# Patient Record
Sex: Female | Born: 1970
Health system: Southern US, Community
[De-identification: ages and names within clinical notes are randomized; demographics above are authoritative.]

## PROBLEM LIST (undated history)

## (undated) DIAGNOSIS — D649 Anemia, unspecified: Secondary | ICD-10-CM

## (undated) DIAGNOSIS — T7840XA Allergy, unspecified, initial encounter: Secondary | ICD-10-CM

## (undated) HISTORY — DX: Allergy, unspecified, initial encounter: T78.40XA

## (undated) HISTORY — DX: Anemia, unspecified: D64.9

---
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
## 1993-01-19 HISTORY — PX: OTHER SURGICAL HISTORY: SHX169

## 1999-01-11 ENCOUNTER — Emergency Department (HOSPITAL_COMMUNITY): Admission: EM | Admit: 1999-01-11 | Discharge: 1999-01-11 | Payer: Self-pay | Admitting: Emergency Medicine

## 2000-05-12 ENCOUNTER — Other Ambulatory Visit: Admission: RE | Admit: 2000-05-12 | Discharge: 2000-05-12 | Payer: Self-pay | Admitting: *Deleted

## 2002-10-16 ENCOUNTER — Other Ambulatory Visit: Admission: RE | Admit: 2002-10-16 | Discharge: 2002-10-16 | Payer: Self-pay | Admitting: Obstetrics and Gynecology

## 2007-04-19 LAB — CONVERTED CEMR LAB: Pap Smear: NORMAL

## 2007-08-15 ENCOUNTER — Ambulatory Visit: Payer: Self-pay | Admitting: *Deleted

## 2007-08-15 ENCOUNTER — Ambulatory Visit (HOSPITAL_BASED_OUTPATIENT_CLINIC_OR_DEPARTMENT_OTHER): Admission: RE | Admit: 2007-08-15 | Discharge: 2007-08-15 | Payer: Self-pay | Admitting: *Deleted

## 2007-08-15 DIAGNOSIS — J309 Allergic rhinitis, unspecified: Secondary | ICD-10-CM | POA: Insufficient documentation

## 2007-08-15 DIAGNOSIS — J45909 Unspecified asthma, uncomplicated: Secondary | ICD-10-CM | POA: Insufficient documentation

## 2007-08-15 DIAGNOSIS — D649 Anemia, unspecified: Secondary | ICD-10-CM | POA: Insufficient documentation

## 2007-08-15 DIAGNOSIS — R0602 Shortness of breath: Secondary | ICD-10-CM | POA: Insufficient documentation

## 2009-09-30 ENCOUNTER — Emergency Department (HOSPITAL_COMMUNITY): Admission: EM | Admit: 2009-09-30 | Discharge: 2009-09-30 | Payer: Self-pay | Admitting: Family Medicine

## 2009-10-02 ENCOUNTER — Ambulatory Visit: Payer: Self-pay | Admitting: Family Medicine

## 2009-10-02 DIAGNOSIS — F329 Major depressive disorder, single episode, unspecified: Secondary | ICD-10-CM

## 2009-10-02 DIAGNOSIS — R002 Palpitations: Secondary | ICD-10-CM | POA: Insufficient documentation

## 2009-10-02 DIAGNOSIS — M25519 Pain in unspecified shoulder: Secondary | ICD-10-CM

## 2009-10-02 DIAGNOSIS — F419 Anxiety disorder, unspecified: Secondary | ICD-10-CM

## 2009-10-02 DIAGNOSIS — F418 Other specified anxiety disorders: Secondary | ICD-10-CM | POA: Insufficient documentation

## 2009-10-03 ENCOUNTER — Telehealth (INDEPENDENT_AMBULATORY_CARE_PROVIDER_SITE_OTHER): Payer: Self-pay | Admitting: *Deleted

## 2009-10-07 LAB — CONVERTED CEMR LAB
ALT: 10 units/L (ref 0–35)
AST: 22 units/L (ref 0–37)
Alkaline Phosphatase: 52 units/L (ref 39–117)
BUN: 13 mg/dL (ref 6–23)
Basophils Absolute: 0 10*3/uL (ref 0.0–0.1)
Bilirubin, Direct: 0.2 mg/dL (ref 0.0–0.3)
Calcium: 9.8 mg/dL (ref 8.4–10.5)
Eosinophils Relative: 0.2 % (ref 0.0–5.0)
Folate: 6.7 ng/mL
GFR calc non Af Amer: 83.2 mL/min (ref >60)
Glucose, Bld: 93 mg/dL (ref 70–99)
Iron: 180 ug/dL — ABNORMAL HIGH (ref 42–145)
Lymphocytes Relative: 11.6 % — ABNORMAL LOW (ref 12.0–46.0)
Monocytes Relative: 4.7 % (ref 3.0–12.0)
Platelets: 242 10*3/uL (ref 150.0–400.0)
Potassium: 4.2 meq/L (ref 3.5–5.1)
RDW: 13.1 % (ref 11.5–14.6)
Sodium: 143 meq/L (ref 135–145)
Total Bilirubin: 1.1 mg/dL (ref 0.3–1.2)
Transferrin: 290.5 mg/dL (ref 212.0–360.0)
WBC: 10.7 10*3/uL — ABNORMAL HIGH (ref 4.5–10.5)

## 2009-10-13 ENCOUNTER — Encounter: Payer: Self-pay | Admitting: Internal Medicine

## 2009-12-31 ENCOUNTER — Telehealth (INDEPENDENT_AMBULATORY_CARE_PROVIDER_SITE_OTHER): Payer: Self-pay | Admitting: *Deleted

## 2010-01-01 ENCOUNTER — Ambulatory Visit: Payer: Self-pay | Admitting: Family Medicine

## 2010-01-03 LAB — CONVERTED CEMR LAB
Basophils Absolute: 0 10*3/uL (ref 0.0–0.1)
HCT: 36.7 % (ref 36.0–46.0)
Hemoglobin: 12.7 g/dL (ref 12.0–15.0)
Lymphs Abs: 1.8 10*3/uL (ref 0.7–4.0)
MCHC: 34.5 g/dL (ref 30.0–36.0)
MCV: 91.2 fL (ref 78.0–100.0)
Monocytes Absolute: 0.4 10*3/uL (ref 0.1–1.0)
Monocytes Relative: 6 % (ref 3.0–12.0)
Neutro Abs: 3.8 10*3/uL (ref 1.4–7.7)
RDW: 13.2 % (ref 11.5–14.6)

## 2010-02-03 ENCOUNTER — Ambulatory Visit
Admission: RE | Admit: 2010-02-03 | Discharge: 2010-02-03 | Payer: Self-pay | Source: Home / Self Care | Attending: Family Medicine | Admitting: Family Medicine

## 2010-02-18 NOTE — Assessment & Plan Note (Signed)
Summary: new to est/kn   Vital Signs:  Patient profile:   40 year old female Height:      66 inches Weight:      156.4 pounds BMI:     25.33 Temp:     98.6 degrees F oral Pulse rate:   76 / minute Pulse rhythm:   regular BP sitting:   116 / 68  (right arm)  Vitals Entered By: Almeta Monas CMA Duncan Dull) (October 02, 2009 10:05 AM) CC: c/o pain in the right shoulder and difficulty sleeping at night and palpitations   Preventive Screening-Counseling & Management  Alcohol-Tobacco     Smoking Status: never  Caffeine-Diet-Exercise     Does Patient Exercise: no      Drug Use:  no.    Current Medications (verified): 1)  Microgestin Fe 1/20 1-20 Mg-Mcg  Tabs (Norethin Ace-Eth Estrad-Fe) .... Take 1 Tablet By Mouth Once A Day 2)  Complete Womens   Tabs (Multiple Vitamins-Minerals) .... Take 1 Tablet By Mouth Once A Day 3)  Calcium 500/d 500-200 Mg-Unit  Tabs (Calcium Carbonate-Vitamin D) .Marland Kitchen.. 1 Tab Once Daily 4)  Claritin 10 Mg  Tabs (Loratadine) .... One Tab By Mouth Qd 5)  Ventolin Hfa 108 (90 Base) Mcg/act  Aers (Albuterol Sulfate) .Marland Kitchen.. 1-2 Puffs Every 4 Hours As Needed Shortness of Breath 6)  Augmentin 875-125 Mg Tabs (Amoxicillin-Pot Clavulanate) .Marland Kitchen.. 1 By Mouth Once Daily X14 Days 7)  Prednisone 5 Mg Tabs (Prednisone) .... 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  Otoscopic examination reveals clear canals, tympanic membranes are intact bilaterally without bulging, retraction, inflammation or discharge. Hearing is grossly normal bilaterally. Nose:  External nasal examination shows no deformity or inflammation. Nasal mucosa are pink and moist without lesions or exudates. Mouth:  Oral mucosa and oropharynx without lesions or exudates.  Teeth in good repair. Neck:  No deformities, masses, or tenderness noted. Lungs:  Normal respiratory effort, chest expands symmetrically. Lungs are clear to auscultation, no crackles or wheezes. Heart:  normal rate and no murmur.   Extremities:  No clubbing, cyanosis, edema, or deformity noted with normal full range of motion of all joints.   Skin:  Intact without suspicious lesions or rashes Psych:  Oriented X3, subdued, and tearful.     Impression & Recommendations:  Problem # 1:  PALPITATIONS, RECURRENT (ICD-785.1)  Orders: Venipuncture (16109) TLB-B12 + Folate Pnl (60454_09811-B14/NWG) TLB-IBC Pnl (Iron/FE;Transferrin) (83550-IBC) TLB-BMP (Basic Metabolic Panel-BMET) (80048-METABOL) TLB-CBC Platelet - w/Differential (85025-CBCD) TLB-Hepatic/Liver Function Pnl (80076-HEPATIC) TLB-TSH (Thyroid Stimulating Hormone) (95621-HYQ) Cardiology Referral (Cardiology) EKG w/ Interpretation (93000) UA Dipstick w/o Micro (manual) (65784) Urine Pregnancy Test  (69629)  Avoid caffeine, chocolate, and stimulants. Stress reduction as well as medication options discussed.   Problem # 2:  SHOULDER PAIN, RIGHT (ICD-719.41)  Discussed shoulder exercises, use of moist heat or ice, and medication.   Orders: EKG  w/ Interpretation (93000) UA Dipstick w/o Micro (manual) (16109) Urine Pregnancy Test  (60454)  Problem # 3:  ANXIETY STATE, UNSPECIFIED (ICD-300.00)  Her updated medication list for this problem includes:    Ativan 0.5 Mg  Tabs (Lorazepam) .Marland Kitchen... 1/2- 1 by mouth three times a day as needed  Orders: Venipuncture (09811) TLB-B12 + Folate Pnl (91478_29562-Z30/QMV) TLB-IBC Pnl (Iron/FE;Transferrin) (83550-IBC) TLB-BMP (Basic Metabolic Panel-BMET) (80048-METABOL) TLB-CBC Platelet - w/Differential (85025-CBCD) TLB-Hepatic/Liver Function Pnl (80076-HEPATIC) TLB-TSH (Thyroid Stimulating Hormone) (84443-TSH) EKG w/ Interpretation (93000) UA Dipstick w/o Micro (manual) (78469) Urine Pregnancy Test  (62952)  Discussed medication use and relaxation techniques.   Problem # 4:  ANEMIA-NOS (ICD-285.9)  Orders: Venipuncture (84132) TLB-B12 + Folate Pnl (44010_27253-G64/QIH) TLB-IBC Pnl (Iron/FE;Transferrin) (83550-IBC) TLB-BMP (Basic Metabolic Panel-BMET) (80048-METABOL) TLB-CBC Platelet - w/Differential (85025-CBCD) TLB-Hepatic/Liver Function Pnl (80076-HEPATIC) TLB-TSH (Thyroid Stimulating Hormone) (84443-TSH) EKG w/ Interpretation (93000) UA Dipstick w/o Micro (manual) (47425) Urine Pregnancy Test  (95638)  Complete Medication List: 1)  Microgestin Fe 1/20 1-20 Mg-mcg Tabs (Norethin ace-eth estrad-fe) .... Take 1 tablet by mouth once a day 2)  Complete Womens Tabs (Multiple vitamins-minerals) .... Take 1 tablet by mouth once a day 3)  Calcium 500/d 500-200 Mg-unit Tabs (Calcium carbonate-vitamin d) .Marland Kitchen.. 1 tab once daily 4)  Claritin 10 Mg Tabs (Loratadine) .... One tab by mouth qd 5)  Ventolin Hfa 108 (90 Base) Mcg/act Aers (Albuterol sulfate) .Marland Kitchen.. 1-2 puffs every 4 hours as needed shortness of breath 6)  Augmentin 875-125 Mg Tabs (Amoxicillin-pot clavulanate) .Marland Kitchen.. 1 by mouth once daily x14 days 7)  Prednisone 5 Mg Tabs (Prednisone) .... Take as directed dose pack 8)  Ativan 0.5 Mg Tabs (Lorazepam) .... 1/2- 1 by mouth three times a day as needed  Patient Instructions: 1)  make appointment for about 2 weeks after monitor is completed. Prescriptions: ATIVAN 0.5 MG TABS (LORAZEPAM) 1/2- 1 by mouth three  times a day as needed  #60 x 0   Entered and Authorized by:   Loreen Freud DO   Signed by:   Loreen Freud DO on 10/02/2009   Method used:   Print then Give to Patient   RxID:   [id number]   Appended Document: new to est/kn

## 2010-02-18 NOTE — Procedures (Signed)
Summary: Summary Report  Summary Report   Imported By: Erle Crocker 11/06/2009 16:04:13  _____________________________________________________________________  External Attachment:    Type:   Image     Comment:   External Document

## 2010-02-18 NOTE — Progress Notes (Signed)
Summary: monitor  Phone Note Other Incoming   Action Taken: Appt scheduled Summary of Call: Talk to pt about event monitot patient would like monitor mail to her home ,this monitor will be mail by [employer] Initial call taken by: Marcos Eke,  October 03, 2009 12:41 PM

## 2010-02-20 NOTE — Assessment & Plan Note (Signed)
Summary: 1 month follow-up medication//fd   Vital Signs:  Patient profile:   40 year old female Weight:      158.0 pounds Pulse rate:   76 / minute Pulse rhythm:   regular BP sitting:   108 / 72  (right arm) Cuff size:   regular  Vitals Entered By: Almeta Monas CMA Duncan Dull) (February 03, 2010 4:29 PM) CC: F/U ON CELEXA--Per pt it makes her tired   History of Present Illness: Pt here to f/u celexa ---- it is helping her anxiety but makes her tired.    Problems Prior to Update: 1)  Family History Breast Cancer 1st Degree Relative <50  (ICD-V16.3) 2)  Shoulder Pain, Right  (ICD-719.41) 3)  Palpitations, Recurrent  (ICD-785.1) 4)  Anxiety State, Unspecified  (ICD-300.00) 5)  Reactive Airway Disease  (ICD-493.90) 6)  Other Follow-up Examination Other  (ICD-V67.59) 7)  Family History of Alcoholism/addiction  (ICD-V61.41) 8)  Shortness of Breath  (ICD-786.05) 9)  Allergic Rhinitis  (ICD-477.9) 10)  Anemia-nos  (ICD-285.9)  Medications Prior to Update: 1)  Microgestin Fe 1/20 1-20 Mg-Mcg  Tabs (Norethin Ace-Eth Estrad-Fe) .... Take 1 Tablet By Mouth Once A Day 2)  Complete Womens   Tabs (Multiple Vitamins-Minerals) .... Take 1 Tablet By Mouth Once A Day 3)  Calcium 500/d 500-200 Mg-Unit  Tabs (Calcium Carbonate-Vitamin D) .Marland Kitchen.. 1 Tab Once Daily 4)  Claritin 10 Mg  Tabs (Loratadine) .... One Tab By Mouth Qd 5)  Ventolin Hfa 108 (90 Base) Mcg/act  Aers (Albuterol Sulfate) .Marland Kitchen.. 1-2 Puffs Every 4 Hours As Needed Shortness of Breath 6)  Augmentin 875-125 Mg Tabs (Amoxicillin-Pot Clavulanate) .Marland Kitchen.. 1 By Mouth Once Daily X14 Days 7)  Prednisone 5 Mg Tabs (Prednisone) .... Take As Directed Dose Pack 8)  Ativan 0.5 Mg Tabs (Lorazepam) .... 1/2- 1 By Mouth Three Times A Day As Needed 9)  Celexa 20 Mg Tabs (Citalopram Hydrobromide) .... Take 1/2 Tab By Mouth Once Daily For 8 Days Then Increase To 1 By Mouth Once Daily  Current Medications (verified): 1)  Microgestin Fe 1/20 1-20 Mg-Mcg  Tabs  (Norethin Ace-Eth Estrad-Fe) .... Take 1 Tablet By Mouth Once A Day 2)  Complete Womens   Tabs (Multiple Vitamins-Minerals) .... Take 1 Tablet By Mouth Once A Day 3)  Calcium 500/d 500-200 Mg-Unit  Tabs (Calcium Carbonate-Vitamin D) .Marland Kitchen.. 1 Tab Once Daily 4)  Claritin 10 Mg  Tabs (Loratadine) .... One Tab By Mouth Qd 5)  Celexa 10 Mg Tabs (Citalopram Hydrobromide) .Marland Kitchen.. 1 By Mouth Once Daily  Allergies (verified): No Known Drug Allergies  Past History:  Past Medical History: Last updated: 08/15/2007 Anemia-NOS  - as a child Allergic rhinitis  Past Surgical History: Last updated: 08/15/2007 Laporoscophy for Endometriosis 1995 Tonsillectomy 1979  Family History: Last updated: 10/02/2009 Family History of Alcoholism - father Family History of Arthritis - father = rheumatoid, mother = osteoarthritis  Family History High cholesterol - mother Family History Hypertension - father Family History Breast cancer 1st degree relative <50  Social History: Last updated: 10/02/2009 Occupation:  [employer], and one more year left of law school Married no children Never Smoked Alcohol use-yes Drug use-no Regular exercise-no  Risk Factors: Alcohol Use: <1 (08/15/2007) Caffeine Use: 2 (08/15/2007) Exercise: no (10/02/2009)  Risk Factors: Smoking Status: never (10/02/2009) Passive Smoke Exposure: yes (08/15/2007)  Family History: Reviewed history from 10/02/2009 and no changes required. Family History of Alcoholism - father Family History of Arthritis - father = rheumatoid, mother = osteoarthritis  Family History High cholesterol - mother Family History Hypertension - father Family History Breast cancer 1st degree relative <50  Social History: Reviewed history from 10/02/2009 and no changes required. 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 Alert and cooperative with normal attention span and concentration. No apparent delusions, illusions, hallucinations   Impression & Recommendations:  Problem # 1:  ANXIETY STATE, UNSPECIFIED (ICD-300.00)  The following medications were removed from the medication list:    Ativan 0.5 Mg Tabs (Lorazepam) .Marland Kitchen... 1/2- 1 by mouth three times a day as needed Her updated medication list for this problem includes:    Celexa 10 Mg Tabs (Citalopram hydrobromide) .Marland Kitchen... 1 by mouth once daily  Discussed medication use and relaxation techniques.   Complete Medication List: 1)  Microgestin Fe 1/20 1-20 Mg-mcg Tabs (Norethin ace-eth estrad-fe) .... Take 1 tablet by mouth once a day 2)  Complete Womens Tabs (Multiple vitamins-minerals) .... Take 1 tablet by mouth once a day 3)  Calcium 500/d 500-200 Mg-unit Tabs (Calcium carbonate-vitamin d) .Marland Kitchen.. 1 tab once daily 4)  Claritin 10 Mg Tabs (Loratadine) .... One tab by mouth qd 5)  Celexa 10 Mg Tabs (Citalopram hydrobromide) .Marland Kitchen.. 1 by mouth once daily  Patient Instructions: 1)  Please schedule a follow-up appointment in 3 months .  Prescriptions: CELEXA 10 MG TABS (CITALOPRAM HYDROBROMIDE) 1 by mouth once daily  #30 x 5   Entered and Authorized by:   Loreen Freud DO   Signed by:   Loreen Freud DO on 02/03/2010   Method used:   Print then Give to Patient   RxID:   [id number]    Orders Added: 1)  Est. Patient Level III [62130]

## 2010-02-20 NOTE — Progress Notes (Signed)
Summary: needs results from heart monitor study  Phone Note Call from Patient Call back at [phone]   Caller: Patient Summary of Call: patient called to find out results of heart monitor study from Sept---called Dr Reece Leader office for results, but they said to call our office for results--see 10/13/2009 results  please call her at [phone] Initial call taken by: Jerolyn Shin,  December 31, 2009 2:11 PM  Follow-up for Phone Call        sinus tachy and N'sr----  if still symptomatic we can refer to cardiology Follow-up by: Loreen Freud DO,  December 31, 2009 2:21 PM  Additional Follow-up for Phone Call Additional follow up Details #1::        left message to call office............Marland KitchenFelecia Deloach CMA  December 31, 2009 3:24 PM   Pt states that she still does have some heart fluttering but would like to try anxiety med to see if this will help and if no relief she will call for referral.........Marland KitchenFelecia Deloach CMA  December 31, 2009 4:42 PM     Additional Follow-up for Phone Call Additional follow up Details #2::    needs ov 4-6 weeks after starting celexa Follow-up by: Loreen Freud DO,  December 31, 2009 5:00 PM  Additional Follow-up for Phone Call Additional follow up Details #3:: Details for Additional Follow-up Action Taken: Left message to call back.... Almeta Monas CMA Duncan Dull)  December 31, 2009 5:03 PM Left message to call office ...............Marland KitchenFelecia Deloach CMA  January 01, 2010 9:48 AM  Discuss with patient ..............Marland KitchenFelecia Deloach CMA  January 02, 2010 8:30 AM

## 2010-02-20 NOTE — Progress Notes (Signed)
Summary: Celexa   Phone Note Call from Patient Call back at [phone]   Caller: Patient Summary of Call: Pt states that she had spoke to Dr. Laury Axon at last OV about starting Celexa but at that time pt wasn't ready. Pt is now requesting an rx for Celexa but doesn't know if she needs an appt to followup first or not. Please advise. Initial call taken by: Lavell Islam,  December 31, 2009 1:50 PM  Follow-up for Phone Call        Please advise if OV needed  to discuss Celexa.Marland KitchenMarland Kitchenpt was last seen 10/02/09. Follow-up by: Almeta Monas CMA Duncan Dull),  December 31, 2009 1:57 PM  Additional Follow-up for Phone Call Additional follow up Details #1::        celexa 20 mg  #30  1 by mouth once daily , 1 refill.    take 1/2 tab by mouth once daily for 8 days then increase to 1 by mouth once daily   Additional Follow-up by: Loreen Freud DO,  December 31, 2009 2:18 PM    Additional Follow-up for Phone Call Additional follow up Details #2::    Left message to call back... Pharmacy needed.... Almeta Monas CMA Duncan Dull)  December 31, 2009 3:49 PM  pt aware rx sent in to pharmacy.Marland KitchenMarland KitchenFelecia Deloach CMA  December 31, 2009 4:45 PM    New/Updated Medications: CELEXA 20 MG TABS (CITALOPRAM HYDROBROMIDE) Take 1/2 tab by mouth once daily for 8 days then increase to 1 by mouth once daily Prescriptions: CELEXA 20 MG TABS (CITALOPRAM HYDROBROMIDE) Take 1/2 tab by mouth once daily for 8 days then increase to 1 by mouth once daily  #30 x 1   Entered by:   Jeremy Johann CMA   Authorized by:   Loreen Freud DO   Signed by:   Jeremy Johann CMA on 12/31/2009   Method used:   Faxed to ...       [facility]. [phone]* (retail)       [address]       [place], [place]  [address]       Ph: [phone] or [phone]       Fax: [phone]   RxID:   [id number]

## 2010-03-10 ENCOUNTER — Telehealth (INDEPENDENT_AMBULATORY_CARE_PROVIDER_SITE_OTHER): Payer: Self-pay | Admitting: *Deleted

## 2010-03-18 NOTE — Progress Notes (Signed)
Summary: medm not strong enough-return call  Phone Note Refill Request Call back at [phone] Message from:  Patient  Refills Requested: Medication #1:  CELEXA 10 MG TABS 1 by mouth once daily. Pt states that med is not strong enough Pt would like to increase to 15mg  to see if this would work. Pt has not filled Rx yet Pt has been used old med for 20mg  and breaking them in half to get the 10mg . Pt uses [facility] in [place].[phone].Pls advise   Follow-up for Phone Call        next dose is 20 mg ---#30  1 by mouth once daily   2 refills Follow-up by: Loreen Freud DO,  March 10, 2010 2:31 PM  Additional Follow-up for Phone Call Additional follow up Details #1::        Left message to call back.... Almeta Monas CMA Duncan Dull)  March 10, 2010 2:42 PM     Additional Follow-up for Phone Call Additional follow up Details #2::    See OV notes 02-03-10 Pt dose was decrease from 20mg  to 10mg  because it is making her tired. Pt is request to take 10mg  tab and half of that to equal 15mg . Pt does not want to go back to the 20mg  due to the med effect on her. Pls advise.........Marland KitchenFelecia Deloach CMA  March 10, 2010 2:53 PM   Additional Follow-up for Phone Call Additional follow up Details #3:: Details for Additional Follow-up Action Taken: pt is returning call .Kalyn Negrete  March 10, 2010 4:04   that is fine  yrlowne 03/10/10 430p   spoke with patient and I advised it was ok for her to go ahead and take 1.5 of the 10 mg, she voiced understanding and stated she would disgard the 20mg  and needs Rx sent to pharmacy.... Additional Follow-up by: Almeta Monas CMA Duncan Dull),  March 10, 2010 4:49 PM  New/Updated Medications: CELEXA 10 MG TABS (CITALOPRAM HYDROBROMIDE) 1 1/2 by mouth once daily Prescriptions: CELEXA 10 MG TABS (CITALOPRAM HYDROBROMIDE) 1 1/2 by mouth once daily  #45 x 0   Entered by:   Almeta Monas CMA (AAMA)   Authorized by:   Loreen Freud DO   Signed by:   Almeta Monas  CMA (AAMA) on 03/10/2010   Method used:   Faxed to ...       [facility]. [phone]* (retail)       [address]       [place], [place]  [address]       Ph: [phone] or [phone]       Fax: [phone]   RxID:   [id number]

## 2010-08-31 ENCOUNTER — Other Ambulatory Visit: Payer: Self-pay | Admitting: Family Medicine

## 2010-11-10 ENCOUNTER — Encounter: Payer: Self-pay | Admitting: Family Medicine

## 2010-11-10 ENCOUNTER — Ambulatory Visit (INDEPENDENT_AMBULATORY_CARE_PROVIDER_SITE_OTHER): Payer: Managed Care, Other (non HMO) | Admitting: Family Medicine

## 2010-11-10 VITALS — BP 116/62 | HR 80 | Temp 98.1°F | Wt 159.6 lb

## 2010-11-10 DIAGNOSIS — F411 Generalized anxiety disorder: Secondary | ICD-10-CM

## 2010-11-10 DIAGNOSIS — F419 Anxiety disorder, unspecified: Secondary | ICD-10-CM

## 2010-11-10 MED ORDER — BUPROPION HCL ER (XL) 150 MG PO TB24: ORAL_TABLET | ORAL | Status: DC

## 2010-11-10 MED ORDER — CITALOPRAM HYDROBROMIDE 10 MG PO TABS: 10.0000 mg | ORAL_TABLET | Freq: Every day | ORAL | Status: DC

## 2010-11-10 NOTE — Patient Instructions (Signed)

## 2010-11-10 NOTE — Assessment & Plan Note (Signed)
Refill celexa Add wellbutrin rto 1-2 months

## 2010-11-10 NOTE — Progress Notes (Signed)
  Subjective:    Patient ID: Angela Harrington, female    DOB: 1970-08-02, 40 y.o.   MRN: [id number]  HPI Pt here f/u depression. She is doing well but feels she still needs more get up and go but the celexa is helping a lot for anxiety.  Review of Systems as above   Objective:   Physical Exam  Constitutional: She is oriented to person, place, and time. She appears well-developed and well-nourished.  Cardiovascular: Normal rate and regular rhythm.   No murmur heard. Pulmonary/Chest: Effort normal and breath sounds normal.  Musculoskeletal: She exhibits no edema.  Neurological: She is alert and oriented to person, place, and time.  Psychiatric: She has a normal mood and affect. Her behavior is normal. Judgment and thought content normal.          Assessment & Plan:

## 2011-02-03 ENCOUNTER — Other Ambulatory Visit: Payer: Self-pay | Admitting: Family Medicine

## 2011-06-30 ENCOUNTER — Telehealth: Payer: Self-pay | Admitting: Family Medicine

## 2011-07-07 NOTE — Telephone Encounter (Signed)
Opened in error

## 2011-07-25 ENCOUNTER — Other Ambulatory Visit: Payer: Self-pay | Admitting: Family Medicine

## 2011-10-27 ENCOUNTER — Other Ambulatory Visit: Payer: Self-pay | Admitting: Family Medicine

## 2011-10-27 NOTE — Telephone Encounter (Signed)
refill Buprop 24 XL tab 150MG  requesting 90 day supply--take 1 tablet daily--last wrt 1.15.13 #30 wt/5-refills to [facility] in [place] Note PHARMACY FOR THIS REQUEST is [facility] home delivery  Last ov 10.22.12 meds mgmt.--nothing scheduled for the future

## 2011-10-27 NOTE — Telephone Encounter (Signed)
Called patient on home# ABM LM to call & schedule f/u as soon as she could

## 2011-10-27 NOTE — Telephone Encounter (Signed)
Please offer this patient an apt for follow.      KP

## 2011-11-05 MED ORDER — BUPROPION HCL ER (XL) 150 MG PO TB24: 150.0000 mg | ORAL_TABLET | Freq: Every day | ORAL | Status: DC

## 2011-11-05 NOTE — Telephone Encounter (Signed)
Coming in for 15-minute ov 10.30.13

## 2011-11-05 NOTE — Addendum Note (Signed)
Addended by: Arnette Norris on: 11/05/2011 03:39 PM   Modules accepted: Orders

## 2011-11-06 ENCOUNTER — Other Ambulatory Visit: Payer: Self-pay

## 2011-11-06 MED ORDER — BUPROPION HCL ER (XL) 150 MG PO TB24: 150.0000 mg | ORAL_TABLET | Freq: Every day | ORAL | Status: DC

## 2011-11-06 NOTE — Telephone Encounter (Signed)
Pt called left message on triage line concerning Rx Wellbutrin. Arman Bogus sent Rx to home delivery but it will take awhile for pt to receive pt need Rx sent to [facility] in [place] so I sent Rx there and called pt left a message on VM making pt aware request was done.     MW

## 2011-11-18 ENCOUNTER — Ambulatory Visit (INDEPENDENT_AMBULATORY_CARE_PROVIDER_SITE_OTHER): Payer: Managed Care, Other (non HMO) | Admitting: Family Medicine

## 2011-11-18 ENCOUNTER — Encounter: Payer: Self-pay | Admitting: Family Medicine

## 2011-11-18 VITALS — BP 108/64 | HR 77 | Temp 98.2°F | Wt 160.0 lb

## 2011-11-18 DIAGNOSIS — Z0000 Encounter for general adult medical examination without abnormal findings: Secondary | ICD-10-CM

## 2011-11-18 DIAGNOSIS — M2550 Pain in unspecified joint: Secondary | ICD-10-CM

## 2011-11-18 DIAGNOSIS — E785 Hyperlipidemia, unspecified: Secondary | ICD-10-CM

## 2011-11-18 DIAGNOSIS — F329 Major depressive disorder, single episode, unspecified: Secondary | ICD-10-CM

## 2011-11-18 DIAGNOSIS — F411 Generalized anxiety disorder: Secondary | ICD-10-CM

## 2011-11-18 LAB — HEPATIC FUNCTION PANEL
Alkaline Phosphatase: 43 U/L (ref 39–117)
Bilirubin, Direct: 0 mg/dL (ref 0.0–0.3)

## 2011-11-18 LAB — BASIC METABOLIC PANEL
CO2: 27 mEq/L (ref 19–32)
Calcium: 8.8 mg/dL (ref 8.4–10.5)
Creatinine, Ser: 0.7 mg/dL (ref 0.4–1.2)
GFR: 94.09 mL/min (ref >60.00)

## 2011-11-18 LAB — TSH: TSH: 0.92 u[IU]/mL (ref 0.35–5.50)

## 2011-11-18 LAB — LIPID PANEL
HDL: 71.2 mg/dL (ref >39.00)
VLDL: 11.6 mg/dL (ref 0.0–40.0)

## 2011-11-18 LAB — CBC WITH DIFFERENTIAL/PLATELET
Basophils Absolute: 0 10*3/uL (ref 0.0–0.1)
Basophils Relative: 0.2 % (ref 0.0–3.0)
Eosinophils Absolute: 0.1 10*3/uL (ref 0.0–0.7)
Lymphocytes Relative: 26.5 % (ref 12.0–46.0)
MCHC: 32.8 g/dL (ref 30.0–36.0)
Neutrophils Relative %: 65.5 % (ref 43.0–77.0)
RBC: 4.33 Mil/uL (ref 3.87–5.11)
RDW: 13.6 % (ref 11.5–14.6)

## 2011-11-18 MED ORDER — BUPROPION HCL ER (XL) 150 MG PO TB24: 150.0000 mg | ORAL_TABLET | Freq: Every day | ORAL | Status: DC

## 2011-11-18 MED ORDER — CITALOPRAM HYDROBROMIDE 10 MG PO TABS: ORAL_TABLET | ORAL | Status: DC

## 2011-11-18 NOTE — Patient Instructions (Addendum)

## 2011-11-18 NOTE — Assessment & Plan Note (Signed)
Pt life is less stressful now with the job change She will d/c celexa con't wellbutrin for now

## 2011-11-18 NOTE — Assessment & Plan Note (Signed)
Check labs 

## 2011-11-18 NOTE — Progress Notes (Signed)
  Subjective:    Patient ID: Angela Harrington, female    DOB: [birth year], 41 y.o.   MRN: [id number]  HPI Pt here for med refill.  Meds are working well.  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  Pt is doing well.   Review of Systems    as above Objective:   Physical Exam  Vitals reviewed. Constitutional: She is oriented to person, place, and time. She appears well-developed and well-nourished.  Cardiovascular: Normal rate, regular rhythm, normal heart sounds and intact distal pulses.   Neurological: She is alert and oriented to person, place, and time.  Psychiatric: She has a normal mood and affect. Her behavior is normal. Judgment and thought content normal.          Assessment & Plan:

## 2011-11-19 LAB — RHEUMATOID FACTOR: Rhuematoid fact SerPl-aCnc: <10 IU/mL (ref <=14)

## 2012-02-02 LAB — HM PAP SMEAR

## 2012-07-29 ENCOUNTER — Encounter: Payer: Self-pay | Admitting: Family Medicine

## 2012-07-29 ENCOUNTER — Ambulatory Visit (INDEPENDENT_AMBULATORY_CARE_PROVIDER_SITE_OTHER): Payer: Managed Care, Other (non HMO) | Admitting: Family Medicine

## 2012-07-29 VITALS — BP 112/72 | HR 84 | Temp 98.4°F | Wt 161.2 lb

## 2012-07-29 DIAGNOSIS — J019 Acute sinusitis, unspecified: Secondary | ICD-10-CM

## 2012-07-29 MED ORDER — AZELASTINE-FLUTICASONE 137-50 MCG/ACT NA SUSP: 1.0000 | Freq: Two times a day (BID) | NASAL | Status: DC

## 2012-07-29 MED ORDER — CEFUROXIME AXETIL 500 MG PO TABS: 500.0000 mg | ORAL_TABLET | Freq: Two times a day (BID) | ORAL | Status: AC

## 2012-07-29 NOTE — Progress Notes (Signed)
  Subjective:     Angela Harrington is a 42 y.o. female who presents for evaluation of sinus pain. Symptoms include: congestion, cough, facial pain, headaches, nasal congestion and sinus pressure. Onset of symptoms was 1 week ago. Symptoms have been gradually worsening since that time. Past history is significant for no history of pneumonia or bronchitis. Patient is a non-smoker.  The following portions of the patient's history were reviewed and updated as appropriate: allergies, current medications, past family history, past medical history, past social history, past surgical history and problem list.  Review of Systems Pertinent items are noted in HPI.   Objective:    BP 112/72  Pulse 84  Temp(Src) 98.4 F (36.9 C) (Oral)  Wt 161 lb 3.2 oz (73.12 kg)  BMI 26.03 kg/m2  SpO2 97% General appearance: alert, cooperative, appears stated age and no distress Ears: normal TM's and external ear canals both ears Nose: green discharge, moderate congestion, turbinates red, swollen, sinus tenderness bilateral Throat: lips, mucosa, and tongue normal; teeth and gums normal Neck: mild anterior cervical adenopathy, no JVD, supple, symmetrical, trachea midline and thyroid not enlarged, symmetric, no tenderness/mass/nodules Lungs: clear to auscultation bilaterally Heart: regular rate and rhythm, S1, S2 normal, no murmur, click, rub or gallop Lymph nodes: Cervical adenopathy: mild adenopathy    Assessment:    Acute bacterial sinusitis.    Plan:    Nasal steroids per medication orders. Antihistamines per medication orders. Ceftin per medication orders. f/u prn

## 2012-07-29 NOTE — Patient Instructions (Signed)

## 2012-08-04 ENCOUNTER — Telehealth: Payer: Self-pay | Admitting: Family Medicine

## 2012-08-04 MED ORDER — FLUCONAZOLE 150 MG PO TABS: ORAL_TABLET | ORAL | Status: DC

## 2012-08-04 NOTE — Telephone Encounter (Signed)
Patient states that the sinus infection medication she was prescribed when she came in on 07/29/12 has caused her to have a yeast infection. She is asking for something to be sent to her pharmacy for the yeast infection. She is leaving town this evening and would like it sent today.

## 2012-08-04 NOTE — Telephone Encounter (Signed)
Per Dr. Laury Axon ok to send in Diflucan 150 x 2. Notified patient and sent Rx to pharmacy. GF/RN

## 2012-12-17 ENCOUNTER — Other Ambulatory Visit: Payer: Self-pay | Admitting: Family Medicine

## 2013-02-01 ENCOUNTER — Ambulatory Visit (INDEPENDENT_AMBULATORY_CARE_PROVIDER_SITE_OTHER): Payer: Managed Care, Other (non HMO) | Admitting: Family Medicine

## 2013-02-01 ENCOUNTER — Encounter: Payer: Self-pay | Admitting: Family Medicine

## 2013-02-01 VITALS — BP 110/70 | HR 69 | Temp 98.9°F | Wt 169.2 lb

## 2013-02-01 DIAGNOSIS — F341 Dysthymic disorder: Secondary | ICD-10-CM

## 2013-02-01 DIAGNOSIS — F32A Depression, unspecified: Secondary | ICD-10-CM

## 2013-02-01 DIAGNOSIS — F329 Major depressive disorder, single episode, unspecified: Secondary | ICD-10-CM

## 2013-02-01 DIAGNOSIS — E663 Overweight: Secondary | ICD-10-CM

## 2013-02-01 DIAGNOSIS — F3289 Other specified depressive episodes: Secondary | ICD-10-CM

## 2013-02-01 DIAGNOSIS — F419 Anxiety disorder, unspecified: Secondary | ICD-10-CM

## 2013-02-01 MED ORDER — CITALOPRAM HYDROBROMIDE 10 MG PO TABS: ORAL_TABLET | ORAL | Status: DC

## 2013-02-01 MED ORDER — BUPROPION HCL ER (XL) 300 MG PO TB24: 300.0000 mg | ORAL_TABLET | Freq: Every day | ORAL | Status: DC

## 2013-02-01 NOTE — Assessment & Plan Note (Signed)
Inc wellbutrin xl 300 mg qd

## 2013-02-01 NOTE — Progress Notes (Signed)
Pre visit review using our clinic review tool, if applicable. No additional management support is needed unless otherwise documented below in the visit note. 

## 2013-02-01 NOTE — Progress Notes (Signed)
   Subjective:    Patient ID: Angela Harrington, female    DOB: 1970-07-05, 43 y.o.   MRN: [id number]  HPI Pt here for f/u depression.  She is doing well.  She is struggling with weight loss and is interested in inc wellbutrin if that could help.  No other complaints.    Review of Systems As above     Objective:   Physical Exam BP 110/70  Pulse 69  Temp(Src) 98.9 F (37.2 C) (Oral)  Wt 169 lb 3.2 oz (76.749 kg)  SpO2 98% General appearance: alert, cooperative, appears stated age and no distress Lungs: clear to auscultation bilaterally Heart: S1, S2 normal Extremities: extremities normal, atraumatic, no cyanosis or edema Neurologic: Alert and oriented X 3, normal strength and tone. Normal symmetric reflexes. Normal coordination and gait        Assessment & Plan:

## 2013-02-01 NOTE — Assessment & Plan Note (Signed)
Inc wellbutrin to 300 mg qd and con't celexa rto 3 months or sooner prn

## 2013-02-01 NOTE — Patient Instructions (Signed)

## 2013-06-22 ENCOUNTER — Encounter: Payer: Self-pay | Admitting: Family Medicine

## 2013-07-03 ENCOUNTER — Ambulatory Visit (INDEPENDENT_AMBULATORY_CARE_PROVIDER_SITE_OTHER): Payer: Managed Care, Other (non HMO) | Admitting: General Surgery

## 2013-09-28 ENCOUNTER — Ambulatory Visit (INDEPENDENT_AMBULATORY_CARE_PROVIDER_SITE_OTHER): Payer: Managed Care, Other (non HMO) | Admitting: Family Medicine

## 2013-09-28 ENCOUNTER — Ambulatory Visit (HOSPITAL_BASED_OUTPATIENT_CLINIC_OR_DEPARTMENT_OTHER)
Admission: RE | Admit: 2013-09-28 | Discharge: 2013-09-28 | Disposition: A | Discharge status: Home / Self Care | Payer: Managed Care, Other (non HMO) | Source: Ambulatory Visit | Attending: Family Medicine | Admitting: Family Medicine

## 2013-09-28 ENCOUNTER — Encounter: Payer: Self-pay | Admitting: Family Medicine

## 2013-09-28 VITALS — BP 124/72 | HR 82 | Temp 98.7°F | Wt 165.1 lb

## 2013-09-28 DIAGNOSIS — J42 Unspecified chronic bronchitis: Secondary | ICD-10-CM

## 2013-09-28 DIAGNOSIS — R0602 Shortness of breath: Secondary | ICD-10-CM | POA: Insufficient documentation

## 2013-09-28 DIAGNOSIS — R0989 Other specified symptoms and signs involving the circulatory and respiratory systems: Secondary | ICD-10-CM

## 2013-09-28 DIAGNOSIS — R0609 Other forms of dyspnea: Secondary | ICD-10-CM

## 2013-09-28 MED ORDER — ALBUTEROL SULFATE HFA 108 (90 BASE) MCG/ACT IN AERS: 2.0000 | INHALATION_SPRAY | Freq: Four times a day (QID) | RESPIRATORY_TRACT | Status: DC | PRN

## 2013-09-28 MED ORDER — BECLOMETHASONE DIPROPIONATE 40 MCG/ACT IN AERS: 2.0000 | INHALATION_SPRAY | Freq: Two times a day (BID) | RESPIRATORY_TRACT | Status: DC

## 2013-09-28 NOTE — Progress Notes (Signed)
Pre visit review using our clinic review tool, if applicable. No additional management support is needed unless otherwise documented below in the visit note. 

## 2013-09-28 NOTE — Patient Instructions (Signed)

## 2013-09-28 NOTE — Progress Notes (Signed)
  Subjective:     Angela Harrington is a 43 y.o. female who presents for evaluation of sinus pain. Symptoms include: congestion, facial pain, headaches, nasal congestion, post nasal drip, sinus pressure, sore throat and ear pressure. Onset of symptoms was 1 month ago. Symptoms have been gradually worsening since that time. Past history is significant for frequent episodes of bronchitis. Patient is a non-smoker.  The following portions of the patient's history were reviewed and updated as appropriate: allergies, current medications, past family history, past medical history, past social history, past surgical history and problem list.  Review of Systems Pertinent items are noted in HPI.   Objective:    BP 124/72  Pulse 82  Temp(Src) 98.7 F (37.1 C) (Oral)  Wt 165 lb 2 oz (74.9 kg)  SpO2 98% General appearance: alert, cooperative, appears stated age and no distress Ears: normal TM's and external ear canals both ears Nose: clear d/c, MM moist, pink Throat: lips, mucosa, and tongue normal; teeth and gums normal Neck: mild anterior cervical adenopathy, supple, symmetrical, trachea midline and thyroid not enlarged, symmetric, no tenderness/mass/nodules Lungs: clear to auscultation bilaterally Heart: S1, S2 normal    Assessment:    Acute allergic sinusitis.    Plan:    Nasal saline sprays. Nasal steroids per medication orders. Antihistamines per medication orders. f/u prn

## 2013-09-29 ENCOUNTER — Encounter: Payer: Self-pay | Admitting: Family Medicine

## 2013-10-04 ENCOUNTER — Telehealth: Payer: Self-pay

## 2013-10-04 NOTE — Telephone Encounter (Signed)
Patient called for x-ray results. Gave results per radiology report.

## 2014-01-08 ENCOUNTER — Other Ambulatory Visit: Payer: Self-pay | Admitting: Family Medicine

## 2014-02-03 ENCOUNTER — Other Ambulatory Visit: Payer: Self-pay | Admitting: Family Medicine

## 2014-02-22 ENCOUNTER — Other Ambulatory Visit: Payer: Self-pay | Admitting: Obstetrics and Gynecology

## 2014-02-22 DIAGNOSIS — R928 Other abnormal and inconclusive findings on diagnostic imaging of breast: Secondary | ICD-10-CM

## 2014-02-23 ENCOUNTER — Encounter (HOSPITAL_COMMUNITY): Payer: Self-pay | Admitting: Emergency Medicine

## 2014-02-23 ENCOUNTER — Emergency Department (HOSPITAL_COMMUNITY): Payer: Managed Care, Other (non HMO)

## 2014-02-23 ENCOUNTER — Inpatient Hospital Stay (HOSPITAL_COMMUNITY)
Admission: EM | Admit: 2014-02-23 | Discharge: 2014-02-25 | DRG: 392 | Disposition: A | Discharge status: Home / Self Care | Payer: Managed Care, Other (non HMO) | Attending: Family Medicine | Admitting: Family Medicine

## 2014-02-23 DIAGNOSIS — F319 Bipolar disorder, unspecified: Secondary | ICD-10-CM | POA: Diagnosis present

## 2014-02-23 DIAGNOSIS — R109 Unspecified abdominal pain: Secondary | ICD-10-CM | POA: Diagnosis present

## 2014-02-23 DIAGNOSIS — Z79899 Other long term (current) drug therapy: Secondary | ICD-10-CM | POA: Diagnosis not present

## 2014-02-23 DIAGNOSIS — J329 Chronic sinusitis, unspecified: Secondary | ICD-10-CM | POA: Diagnosis present

## 2014-02-23 DIAGNOSIS — N730 Acute parametritis and pelvic cellulitis: Secondary | ICD-10-CM

## 2014-02-23 DIAGNOSIS — K529 Noninfective gastroenteritis and colitis, unspecified: Principal | ICD-10-CM | POA: Diagnosis present

## 2014-02-23 LAB — COMPREHENSIVE METABOLIC PANEL
ALT: 15 U/L (ref 0–35)
AST: 22 U/L (ref 0–37)
Albumin: 4.2 g/dL (ref 3.5–5.2)
Alkaline Phosphatase: 72 U/L (ref 39–117)
Anion gap: 12 (ref 5–15)
BUN: 12 mg/dL (ref 6–23)
CHLORIDE: 103 mmol/L (ref 96–112)
CO2: 21 mmol/L (ref 19–32)
Calcium: 9.2 mg/dL (ref 8.4–10.5)
Creatinine, Ser: 0.84 mg/dL (ref 0.50–1.10)
GFR calc non Af Amer: 85 mL/min — ABNORMAL LOW (ref >90)
Glucose, Bld: 123 mg/dL — ABNORMAL HIGH (ref 70–99)
POTASSIUM: 3.4 mmol/L — AB (ref 3.5–5.1)
Sodium: 136 mmol/L (ref 135–145)
TOTAL PROTEIN: 8.1 g/dL (ref 6.0–8.3)
Total Bilirubin: 0.7 mg/dL (ref 0.3–1.2)

## 2014-02-23 LAB — CBC WITH DIFFERENTIAL/PLATELET
Basophils Absolute: 0 10*3/uL (ref 0.0–0.1)
Basophils Relative: 0 % (ref 0–1)
Eosinophils Absolute: 0 10*3/uL (ref 0.0–0.7)
Eosinophils Relative: 0 % (ref 0–5)
HCT: 37 % (ref 36.0–46.0)
Hemoglobin: 12.5 g/dL (ref 12.0–15.0)
Lymphocytes Relative: 4 % — ABNORMAL LOW (ref 12–46)
Lymphs Abs: 0.7 10*3/uL (ref 0.7–4.0)
MCH: 30.3 pg (ref 26.0–34.0)
MCHC: 33.8 g/dL (ref 30.0–36.0)
MCV: 89.6 fL (ref 78.0–100.0)
MONOS PCT: 5 % (ref 3–12)
Monocytes Absolute: 1 10*3/uL (ref 0.1–1.0)
NEUTROS PCT: 91 % — AB (ref 43–77)
Neutro Abs: 17.5 10*3/uL — ABNORMAL HIGH (ref 1.7–7.7)
PLATELETS: 328 10*3/uL (ref 150–400)
RBC: 4.13 MIL/uL (ref 3.87–5.11)
RDW: 12.8 % (ref 11.5–15.5)
WBC: 19.2 10*3/uL — ABNORMAL HIGH (ref 4.0–10.5)

## 2014-02-23 LAB — URINE MICROSCOPIC-ADD ON

## 2014-02-23 LAB — URINALYSIS, ROUTINE W REFLEX MICROSCOPIC
Bilirubin Urine: NEGATIVE
GLUCOSE, UA: NEGATIVE mg/dL
Hgb urine dipstick: NEGATIVE
Ketones, ur: 40 mg/dL — AB
LEUKOCYTES UA: NEGATIVE
Nitrite: NEGATIVE
PROTEIN: 30 mg/dL — AB
SPECIFIC GRAVITY, URINE: 1.029 (ref 1.005–1.030)
UROBILINOGEN UA: 1 mg/dL (ref 0.0–1.0)
pH: 7.5 (ref 5.0–8.0)

## 2014-02-23 LAB — POC URINE PREG, ED: Preg Test, Ur: NEGATIVE

## 2014-02-23 LAB — WET PREP, GENITAL
CLUE CELLS WET PREP: NONE SEEN
TRICH WET PREP: NONE SEEN
Yeast Wet Prep HPF POC: NONE SEEN

## 2014-02-23 LAB — LIPASE, BLOOD: Lipase: 18 U/L (ref 11–59)

## 2014-02-23 MED ORDER — ONDANSETRON HCL 4 MG/2ML IJ SOLN: 4.0000 mg | Freq: Four times a day (QID) | INTRAMUSCULAR | Status: DC | PRN

## 2014-02-23 MED ORDER — ALBUTEROL SULFATE HFA 108 (90 BASE) MCG/ACT IN AERS: 2.0000 | INHALATION_SPRAY | Freq: Four times a day (QID) | RESPIRATORY_TRACT | Status: DC | PRN

## 2014-02-23 MED ORDER — IOHEXOL 300 MG/ML  SOLN
50.0000 mL | Freq: Once | INTRAMUSCULAR | Status: AC | PRN
  Administered 2014-02-23: 50 mL via ORAL

## 2014-02-23 MED ORDER — DEXTROSE 5 % IV SOLN
2.0000 g | Freq: Once | INTRAVENOUS | Status: AC
  Administered 2014-02-23: 2 g via INTRAVENOUS
  Filled 2014-02-23: qty 2

## 2014-02-23 MED ORDER — METRONIDAZOLE IN NACL 5-0.79 MG/ML-% IV SOLN
500.0000 mg | Freq: Three times a day (TID) | INTRAVENOUS | Status: DC
  Administered 2014-02-24 – 2014-02-25 (×5): 500 mg via INTRAVENOUS
  Filled 2014-02-23 (×5): qty 100

## 2014-02-23 MED ORDER — ONDANSETRON HCL 4 MG PO TABS: 4.0000 mg | ORAL_TABLET | Freq: Four times a day (QID) | ORAL | Status: DC | PRN

## 2014-02-23 MED ORDER — SODIUM CHLORIDE 0.9 % IV BOLUS (SEPSIS)
1000.0000 mL | Freq: Once | INTRAVENOUS | Status: AC
  Administered 2014-02-23: 1000 mL via INTRAVENOUS

## 2014-02-23 MED ORDER — DEXTROSE 5 % IV SOLN
100.0000 mg | Freq: Once | INTRAVENOUS | Status: AC
  Administered 2014-02-23: 100 mg via INTRAVENOUS
  Filled 2014-02-23: qty 100

## 2014-02-23 MED ORDER — FLUTICASONE PROPIONATE HFA 44 MCG/ACT IN AERO
1.0000 | INHALATION_SPRAY | Freq: Two times a day (BID) | RESPIRATORY_TRACT | Status: DC
  Administered 2014-02-24 – 2014-02-25 (×3): 1 via RESPIRATORY_TRACT
  Filled 2014-02-23: qty 10.6

## 2014-02-23 MED ORDER — CIPROFLOXACIN IN D5W 400 MG/200ML IV SOLN
400.0000 mg | Freq: Two times a day (BID) | INTRAVENOUS | Status: DC
  Administered 2014-02-24 – 2014-02-25 (×3): 400 mg via INTRAVENOUS
  Filled 2014-02-23 (×3): qty 200

## 2014-02-23 MED ORDER — ALBUTEROL SULFATE (2.5 MG/3ML) 0.083% IN NEBU: 2.5000 mg | INHALATION_SOLUTION | Freq: Four times a day (QID) | RESPIRATORY_TRACT | Status: DC | PRN

## 2014-02-23 MED ORDER — NORETHIN ACE-ETH ESTRAD-FE 1-20 MG-MCG PO TABS: 1.0000 | ORAL_TABLET | Freq: Every day | ORAL | Status: DC

## 2014-02-23 MED ORDER — HEPARIN SODIUM (PORCINE) 5000 UNIT/ML IJ SOLN
5000.0000 [IU] | Freq: Three times a day (TID) | INTRAMUSCULAR | Status: DC
  Administered 2014-02-24 – 2014-02-25 (×5): 5000 [IU] via SUBCUTANEOUS
  Filled 2014-02-23 (×5): qty 1

## 2014-02-23 MED ORDER — IOHEXOL 300 MG/ML  SOLN
100.0000 mL | Freq: Once | INTRAMUSCULAR | Status: AC | PRN
  Administered 2014-02-23: 100 mL via INTRAVENOUS

## 2014-02-23 MED ORDER — SODIUM CHLORIDE 0.9 % IV SOLN
INTRAVENOUS | Status: DC
  Administered 2014-02-24: 22:00:00 via INTRAVENOUS
  Administered 2014-02-24: 125 mL/h via INTRAVENOUS
  Administered 2014-02-24: via INTRAVENOUS

## 2014-02-23 MED ORDER — BUPROPION HCL ER (XL) 300 MG PO TB24
300.0000 mg | ORAL_TABLET | Freq: Every day | ORAL | Status: DC
  Administered 2014-02-24 – 2014-02-25 (×2): 300 mg via ORAL
  Filled 2014-02-23 (×2): qty 1

## 2014-02-23 MED ORDER — CITALOPRAM HYDROBROMIDE 10 MG PO TABS
10.0000 mg | ORAL_TABLET | Freq: Every day | ORAL | Status: DC
  Administered 2014-02-24 – 2014-02-25 (×2): 10 mg via ORAL
  Filled 2014-02-23 (×2): qty 1

## 2014-02-23 NOTE — ED Provider Notes (Signed)
CSN: [id number]     Arrival date & time 02/23/14  1316 History   None    Chief Complaint  Patient presents with  . Abdominal Pain     (Consider location/radiation/quality/duration/timing/severity/associated sxs/prior Treatment) HPI Angela Harrington is a 44 y.o. female who comes in for evaluation of abdominal discomfort. Patient states she has had a "burning and cramping" sensation diffusely in her abdomen since Monday. She is tried taking Pepto-Bismol and Advil with some relief. She rates her discomfort as a 1 or 2/10 as long as she is not moving. She reports movement, eating or drinking exacerbate her discomfort. She reports one episode of emesis earlier this morning that was stomach contents, denies bloody or bilious vomit. Last bowel movement yesterday morning and was normal for her. Denies fevers, urinary symptoms, vaginal bleeding or discharge. Denies nausea at this time. Last ate this morning at 8:30 AM. Reports that she had a Pap smear at her PCP last week. Currently takes oral contraception. Last menstrual period in December and normal for her. Denies headache, changes in vision, chest pain, shortness of breath, cough, diarrhea, numbness or weakness, syncope, rash.  Past Medical History  Diagnosis Date  . Anemia as a child  . Allergy     rhinitis  . Endometriosis 1995   Past Surgical History  Procedure Laterality Date  . Laporoscopy for endometriosis  1995  . Tonsillectomy  1979   Family History  Problem Relation Age of Onset  . Arthritis Mother     osteoarthritis  . Hyperlipidemia Mother   . Alcohol abuse Father   . Arthritis Father     rheumatoid  . Hypertension Father   . Cancer Other     breast   History  Substance Use Topics  . Smoking status: Never Smoker   . Smokeless tobacco: Never Used  . Alcohol Use: Yes     Comment: Socially   OB History    No data available     Review of Systems  All other systems reviewed and are negative.  A 10 point review of  systems was completed and was negative except for pertinent positives and negatives as mentioned in the history of present illness     Allergies  Review of patient's allergies indicates no known allergies.  Home Medications   Prior to Admission medications   Medication Sig Start Date End Date Taking? Authorizing Provider  acetaminophen (TYLENOL) 500 MG tablet Take 1,500 mg by mouth every 6 (six) hours as needed for moderate pain.   Yes Historical Provider, MD  albuterol (PROAIR HFA) 108 (90 BASE) MCG/ACT inhaler Inhale 2 puffs into the lungs every 6 (six) hours as needed for wheezing or shortness of breath. 09/28/13  Yes Yvonne R Lowne, DO  beclomethasone (QVAR) 40 MCG/ACT inhaler Inhale 2 puffs into the lungs 2 (two) times daily. 09/28/13  Yes Yvonne R Lowne, DO  buPROPion (WELLBUTRIN XL) 300 MG 24 hr tablet TAKE 1 TABLET BY MOUTH EVERY DAY 01/09/14  Yes Alferd Apa Lowne, DO  citalopram (CELEXA) 10 MG tablet Take 1 tablet (10 mg total) by mouth daily. Office visit due now 02/05/14  Yes Rosalita Chessman, DO  norethindrone-ethinyl estradiol (JUNEL FE,GILDESS FE,LOESTRIN FE) 1-20 MG-MCG tablet Take 1 tablet by mouth daily.     Yes Historical Provider, MD   BP 137/78 mmHg  Pulse 108  Temp(Src) 97.6 F (36.4 C) (Oral)  Resp 14  SpO2 99%  LMP 01/23/2014 Physical Exam  Constitutional: She is oriented to  person, place, and time. She appears well-developed and well-nourished.  HENT:  Head: Normocephalic and atraumatic.  Dry mucous membranes  Eyes: Conjunctivae are normal. Pupils are equal, round, and reactive to light. Right eye exhibits no discharge. Left eye exhibits no discharge. No scleral icterus.  Neck: Normal range of motion. Neck supple.  Cardiovascular: Normal rate, regular rhythm and normal heart sounds.   Pulmonary/Chest: Effort normal and breath sounds normal. No respiratory distress. She has no wheezes. She has no rales.  Abdominal: Soft. There is no tenderness.  Diffuse ttp across  entire abdomen with increased focal tenderness in RLQ. No rebound or guarding. Abdomen otherwise soft, nondistended. No lesions or masses. Bowel sounds present in all fields.  Genitourinary:  Chaperone was present for the entire genital exam. No lesions or rashes appreciated on vulva. Cervix visualized on speculum exam and appropriate cultures sampled. Scant blood in vaginal vault. Discharge: Thick white. Upon bi manual exam- No TTP of the adnexa, Positive for cervical motion tenderness. No fullness or masses appreciated. No abnormalities appreciated in structural anatomy.   Musculoskeletal: Normal range of motion. She exhibits no tenderness.  Neurological: She is alert and oriented to person, place, and time.  Cranial Nerves II-XII grossly intact  Skin: Skin is warm and dry. No rash noted.  Psychiatric: She has a normal mood and affect.  Nursing note and vitals reviewed.   ED Course  Procedures (including critical care time) Labs Review Labs Reviewed  WET PREP, GENITAL - Abnormal; Notable for the following:    WBC, Wet Prep HPF POC MODERATE (*)    All other components within normal limits  CBC WITH DIFFERENTIAL/PLATELET - Abnormal; Notable for the following:    WBC 19.2 (*)    Neutrophils Relative % 91 (*)    Neutro Abs 17.5 (*)    Lymphocytes Relative 4 (*)    All other components within normal limits  COMPREHENSIVE METABOLIC PANEL - Abnormal; Notable for the following:    Potassium 3.4 (*)    Glucose, Bld 123 (*)    GFR calc non Af Amer 85 (*)    All other components within normal limits  URINALYSIS, ROUTINE W REFLEX MICROSCOPIC - Abnormal; Notable for the following:    APPearance CLOUDY (*)    Ketones, ur 40 (*)    Protein, ur 30 (*)    All other components within normal limits  URINE MICROSCOPIC-ADD ON - Abnormal; Notable for the following:    Squamous Epithelial / LPF FEW (*)    Bacteria, UA FEW (*)    All other components within normal limits  LIPASE, BLOOD  POC  URINE PREG, ED  GC/CHLAMYDIA PROBE AMP ([facility])    Imaging Review Ct Abdomen Pelvis W Contrast  02/23/2014   CLINICAL DATA:  Mid abdominal pain radiating to back, vomiting, history of endometriosis  EXAM: CT ABDOMEN AND PELVIS WITH CONTRAST  TECHNIQUE: Multidetector CT imaging of the abdomen and pelvis was performed using the standard protocol following bolus administration of intravenous contrast.  CONTRAST:  174mL OMNIPAQUE IOHEXOL 300 MG/ML  SOLN  COMPARISON:  None.  FINDINGS: Lower chest:  Lung bases are clear.  Hepatobiliary: Liver is notable for a 6 mm probable cyst in the inferior right hepatic lobe (series 2/ image 40), measuring simple fluid density on the coronal images.  Gallbladder is unremarkable.  Pancreas: Within normal limits.  Spleen: Within normal limits.  Adrenals/Urinary Tract: Adrenal glands are unremarkable.  Kidneys are within normal limits.  No hydronephrosis.  Bladder  is within normal limits  Stomach/Bowel: Stomach is within normal limits.  No evidence of bowel obstruction.  Normal appendix.  Possible mass-like wall thickening involving the cecum (series 2/image 58), although poorly evaluated on the current CT.  Vascular/Lymphatic: No evidence of abdominal aortic aneurysm.  8 mm ileocolic node (series 2/image 54)  Otherwise, no suspicious abdominopelvic lymphadenopathy.  Reproductive: Uterus is unremarkable.  No adnexal masses.  Other: Small volume pelvic ascites.  Musculoskeletal: Visualized osseous structures are within normal limits.  IMPRESSION: No evidence of bowel obstruction.  Normal appendix.  Possible cecal wall thickening/mass, poorly evaluated. Colonoscopy is suggested to exclude underlying colonic neoplasm.  8 mm ileocolic node.   Electronically Signed   By: Julian Hy M.D.   On: 02/23/2014 18:39     EKG Interpretation None      Meds given in ED:  Medications  cefOXitin (MEFOXIN) 2 g in dextrose 5 % 50 mL IVPB (2 g Intravenous New Bag/Given 02/23/14 2236)   doxycycline (VIBRAMYCIN) 100 mg in dextrose 5 % 250 mL IVPB (not administered)  sodium chloride 0.9 % bolus 1,000 mL (0 mLs Intravenous Stopped 02/23/14 1615)  sodium chloride 0.9 % bolus 1,000 mL (0 mLs Intravenous Stopped 02/23/14 1856)  iohexol (OMNIPAQUE) 300 MG/ML solution 50 mL (50 mLs Oral Contrast Given 02/23/14 1631)  iohexol (OMNIPAQUE) 300 MG/ML solution 100 mL (100 mLs Intravenous Contrast Given 02/23/14 1754)  sodium chloride 0.9 % bolus 1,000 mL (1,000 mLs Intravenous New Bag/Given 02/23/14 1917)    New Prescriptions   No medications on file   Filed Vitals:   02/23/14 1541 02/23/14 1749 02/23/14 2005 02/23/14 2227  BP: 113/64 122/69 130/75 137/78  Pulse: 102 105 113 108  Temp:      TempSrc:      Resp: 16 16 18 14   SpO2: 99% 98% 98% 99%    MDM  Vitals stable, although patient increasingly tachycardic  -afebrile Pt resting comfortably in ED. recheck at 16:13, patient states she feels okay. PE--diffuse abdominal tenderness with focal tenderness in right lower quadrant. Cervical motion tenderness. Will obtain CT abdomen for further evaluation. Labwork--leukocytosis 19.2, moderate WBC on wet prep Imaging--CT abdomen shows possible cecal wall thickening versus mass. Suggest colonoscopy to exclude neoplasm. No evidence of bowel obstruction or appendicitis, diverticulitis.  Consult to general surgery. They agree with the radiologist that there is no evidence of appendicitis or diverticulitis. She will need to be admitted to medicine service for IV fluids, bowel rest and IV antibiotics. Initiated Cefoxitin and Doxy in ED to cover for potential intra-abdominal infx as well as PID.  I discussed all relevant lab findings and imaging results with pt and they verbalized understanding. Discussed the need for admission with patient, she was amenable to plan. Consult placed to internal medicine, Dr. Hurley Cisco. Patient admitted  Final diagnoses:  PID (acute pelvic inflammatory disease)   Abdominal discomfort        Verl Dicker, PA-C 02/24/14 1118  Charlesetta Shanks, MD 02/27/14 [phone]

## 2014-02-23 NOTE — ED Notes (Signed)
Pt unable to give urine sample at this time, Pt just used restroom a few minutes ago and did not know she needed to give sample.

## 2014-02-23 NOTE — ED Notes (Signed)
Pt c/o mid abdominal pain radiating bilaterally to back onset Tuesday, worsening progressive, emesis onset today, constipation. Denies urinary symptoms.

## 2014-02-23 NOTE — H&P (Signed)
[facility] History and Physical  Angela HELSER Harrington:[id number] DOB: 05-Jul-1970 DOA: 02/23/2014  Referring physician: EDP PCP: Garnet Koyanagi, DO   Chief Complaint: Abdominal pain   HPI: Angela Harrington is a 44 y.o. female who presents to the ED with c/o abdominal pain.  Symptoms onset on Monday and have been persistent since then.  Worse with moving, better at rest.  Located in her RLQ.  One episode of emesis this morning.  Last BM was yesterday morning and was normal.  Review of Systems: Systems reviewed.  As above, otherwise negative  Past Medical History  Diagnosis Date  . Anemia as a child  . Allergy     rhinitis  . Endometriosis 1995   Past Surgical History  Procedure Laterality Date  . Laporoscopy for endometriosis  1995  . Tonsillectomy  1979   Social History:  reports that she has never smoked. She has never used smokeless tobacco. She reports that she drinks alcohol. She reports that she does not use illicit drugs.  No Known Allergies  Family History  Problem Relation Age of Onset  . Arthritis Mother     osteoarthritis  . Hyperlipidemia Mother   . Alcohol abuse Father   . Arthritis Father     rheumatoid  . Hypertension Father   . Cancer Other     breast     Prior to Admission medications   Medication Sig Start Date End Date Taking? Authorizing Provider  acetaminophen (TYLENOL) 500 MG tablet Take 1,500 mg by mouth every 6 (six) hours as needed for moderate pain.   Yes Historical Provider, MD  albuterol (PROAIR HFA) 108 (90 BASE) MCG/ACT inhaler Inhale 2 puffs into the lungs every 6 (six) hours as needed for wheezing or shortness of breath. 09/28/13  Yes Yvonne R Lowne, DO  beclomethasone (QVAR) 40 MCG/ACT inhaler Inhale 2 puffs into the lungs 2 (two) times daily. 09/28/13  Yes Yvonne R Lowne, DO  buPROPion (WELLBUTRIN XL) 300 MG 24 hr tablet TAKE 1 TABLET BY MOUTH EVERY DAY 01/09/14  Yes Alferd Apa Lowne, DO  citalopram (CELEXA) 10 MG tablet Take 1 tablet (10 mg  total) by mouth daily. Office visit due now 02/05/14  Yes Rosalita Chessman, DO  norethindrone-ethinyl estradiol (JUNEL FE,GILDESS FE,LOESTRIN FE) 1-20 MG-MCG tablet Take 1 tablet by mouth daily.     Yes Historical Provider, MD   Physical Exam: Filed Vitals:   02/23/14 2317  BP: 127/68  Pulse:   Temp: 98.9 F (37.2 C)  Resp: 16    BP 127/68 mmHg  Pulse 108  Temp(Src) 98.9 F (37.2 C) (Oral)  Resp 16  SpO2 99%  LMP 01/23/2014  General Appearance:    Alert, oriented, no distress, appears stated age  Head:    Normocephalic, atraumatic  Eyes:    PERRL, EOMI, sclera non-icteric        Nose:   Nares without drainage or epistaxis. Mucosa, turbinates normal  Throat:   Moist mucous membranes. Oropharynx without erythema or exudate.  Neck:   Supple. No carotid bruits.  No thyromegaly.  No lymphadenopathy.   Back:     No CVA tenderness, no spinal tenderness  Lungs:     Clear to auscultation bilaterally, without wheezes, rhonchi or rales  Chest wall:    No tenderness to palpitation  Heart:    Regular rate and rhythm without murmurs, gallops, rubs  Abdomen:     Soft, non-tender, nondistended, normal bowel sounds, no organomegaly  Genitalia:  deferred  Rectal:    deferred  Extremities:   No clubbing, cyanosis or edema.  Pulses:   2+ and symmetric all extremities  Skin:   Skin color, texture, turgor normal, no rashes or lesions  Lymph nodes:   Cervical, supraclavicular, and axillary nodes normal  Neurologic:   CNII-XII intact. Normal strength, sensation and reflexes      throughout    Labs on Admission:  Basic Metabolic Panel:  Recent Labs Lab 02/23/14 1455  NA 136  K 3.4*  CL 103  CO2 21  GLUCOSE 123*  BUN 12  CREATININE 0.84  CALCIUM 9.2   Liver Function Tests:  Recent Labs Lab 02/23/14 1455  AST 22  ALT 15  ALKPHOS 72  BILITOT 0.7  PROT 8.1  ALBUMIN 4.2    Recent Labs Lab 02/23/14 1455  LIPASE 18   No results for input(s): AMMONIA in the last 168  hours. CBC:  Recent Labs Lab 02/23/14 1455  WBC 19.2*  NEUTROABS 17.5*  HGB 12.5  HCT 37.0  MCV 89.6  PLT 328   Cardiac Enzymes: No results for input(s): CKTOTAL, CKMB, CKMBINDEX, TROPONINI in the last 168 hours.  BNP (last 3 results) No results for input(s): PROBNP in the last 8760 hours. CBG: No results for input(s): GLUCAP in the last 168 hours.  Radiological Exams on Admission: Ct Abdomen Pelvis W Contrast  02/23/2014   CLINICAL DATA:  Mid abdominal pain radiating to back, vomiting, history of endometriosis  EXAM: CT ABDOMEN AND PELVIS WITH CONTRAST  TECHNIQUE: Multidetector CT imaging of the abdomen and pelvis was performed using the standard protocol following bolus administration of intravenous contrast.  CONTRAST:  159mL OMNIPAQUE IOHEXOL 300 MG/ML  SOLN  COMPARISON:  None.  FINDINGS: Lower chest:  Lung bases are clear.  Hepatobiliary: Liver is notable for a 6 mm probable cyst in the inferior right hepatic lobe (series 2/ image 40), measuring simple fluid density on the coronal images.  Gallbladder is unremarkable.  Pancreas: Within normal limits.  Spleen: Within normal limits.  Adrenals/Urinary Tract: Adrenal glands are unremarkable.  Kidneys are within normal limits.  No hydronephrosis.  Bladder is within normal limits  Stomach/Bowel: Stomach is within normal limits.  No evidence of bowel obstruction.  Normal appendix.  Possible mass-like wall thickening involving the cecum (series 2/image 58), although poorly evaluated on the current CT.  Vascular/Lymphatic: No evidence of abdominal aortic aneurysm.  8 mm ileocolic node (series 2/image 54)  Otherwise, no suspicious abdominopelvic lymphadenopathy.  Reproductive: Uterus is unremarkable.  No adnexal masses.  Other: Small volume pelvic ascites.  Musculoskeletal: Visualized osseous structures are within normal limits.  IMPRESSION: No evidence of bowel obstruction.  Normal appendix.  Possible cecal wall thickening/mass, poorly evaluated.  Colonoscopy is suggested to exclude underlying colonic neoplasm.  8 mm ileocolic node.   Electronically Signed   By: Julian Hy M.D.   On: 02/23/2014 18:39    EKG: Independently reviewed.  Assessment/Plan Principal Problem:   Colitis Active Problems:   Abdominal discomfort   1. Colitis - suspect infection despite the CT scan.  Patient just had a colonoscopy in May of last year which was negative other than 2 very small benign polyps.  Neoplasm seems less likely. 1. Cipro and flagyl, will start these in morning as patient just received treatment for PID in ED with 2 other ABx. 2. zofran PRN nausea 3. IVF 4. May wish to inform patients GI doctor, (Dr. Collene Mares) of the CT findings in AM, but  again, this is not presenting at all like I would expect from colon cancer, and colonoscopy was negative less than a year ago.  Certianly should talk with Dr. Collene Mares if symptoms do not improve.    Code Status: Full Code  Family Communication: Husband at bedside Disposition Plan: Admit to inpatient   Time spent: 50 min  Hadiyah Maricle M. [facility] Pager [phone]  If 7AM-7PM, please contact the day team taking care of the patient [email or web address] Password [email or web address] 02/23/2014, 11:17 PM

## 2014-02-23 NOTE — ED Notes (Signed)
Patient will try to urinate in a bit after nurse finishes with her

## 2014-02-24 ENCOUNTER — Encounter (HOSPITAL_COMMUNITY): Payer: Self-pay | Admitting: *Deleted

## 2014-02-24 DIAGNOSIS — K529 Noninfective gastroenteritis and colitis, unspecified: Principal | ICD-10-CM

## 2014-02-24 MED ORDER — ACETAMINOPHEN 325 MG PO TABS
650.0000 mg | ORAL_TABLET | Freq: Once | ORAL | Status: AC
  Administered 2014-02-24: 650 mg via ORAL
  Filled 2014-02-24: qty 2

## 2014-02-24 MED ORDER — ACETAMINOPHEN 325 MG PO TABS
650.0000 mg | ORAL_TABLET | Freq: Four times a day (QID) | ORAL | Status: DC | PRN
  Administered 2014-02-24 – 2014-02-25 (×3): 650 mg via ORAL
  Filled 2014-02-24 (×3): qty 2

## 2014-02-24 MED ORDER — IBUPROFEN 200 MG PO TABS
400.0000 mg | ORAL_TABLET | Freq: Four times a day (QID) | ORAL | Status: DC
Start: 2014-02-24 — End: 2014-02-24
  Administered 2014-02-24: 400 mg via ORAL
  Filled 2014-02-24: qty 2

## 2014-02-24 NOTE — Progress Notes (Signed)
Angela Harrington GLO:[id number] DOB: 04-15-1970 DOA: 02/23/2014 PCP: Garnet Koyanagi, DO  Brief narrative: 44 year old Caucasian female known history of hemorrhoids followed by Dr. Sonny Dandy colonoscopy 05/2013 completely normal, endometriosis 1995, on chronic OCPs, normally para, chronic sinusitis, admitted to the hospital 02/23/13 with abdominal pain starting on 02/19/14 which progressed to the point that she had unbearable pain , vomited food yesterday and came to the emergency room .  \  Past medical history-As per Problem list  Chart reviewed as below-   Consultants:  None  Procedures:  CT scan  Antibiotics:  Ciprofloxacin 2/5  Flagyl 2/5   Subjective  Better, pain decreased, no nausea no vomiting Thinks she can eat No chest pain No blurred or double vision    Objective    Interim History: None  Telemetry: Sinus rhythm   Objective: Filed Vitals:   02/23/14 2330 02/24/14 0513 02/24/14 0842 02/24/14 1354  BP: 131/69 117/66  117/60  Pulse: 112 95  90  Temp: 98.7 F (37.1 C) 98.4 F (36.9 C)  97.8 F (36.6 C)  TempSrc: Oral Oral  Oral  Resp: 16 16  18   Height: 5\' 5"  (1.651 m)     Weight: 74.844 kg (165 lb)     SpO2: 99% 98% 98% 99%    Intake/Output Summary (Last 24 hours) at 02/24/14 1520 Last data filed at 02/24/14 1357  Gross per 24 hour  Intake 760.42 ml  Output   1900 ml  Net -1139.58 ml    Exam:  General: Alert pleasant slightly tired appearing Caucasian female in no apparent distress Cardiovascular: S1-S2 no murmur rub or gallop Respiratory: Clinically clear Abdomen: Soft slight epigastric tenderness slight lower quadrant tenderness Skin no lower extremity edema Neuro range of motion intact  Data Reviewed: Basic Metabolic Panel:  Recent Labs Lab 02/23/14 1455  NA 136  K 3.4*  CL 103  CO2 21  GLUCOSE 123*  BUN 12  CREATININE 0.84  CALCIUM 9.2   Liver Function Tests:  Recent Labs Lab 02/23/14 1455  AST 22  ALT 15  ALKPHOS  72  BILITOT 0.7  PROT 8.1  ALBUMIN 4.2    Recent Labs Lab 02/23/14 1455  LIPASE 18   No results for input(s): AMMONIA in the last 168 hours. CBC:  Recent Labs Lab 02/23/14 1455  WBC 19.2*  NEUTROABS 17.5*  HGB 12.5  HCT 37.0  MCV 89.6  PLT 328   Cardiac Enzymes: No results for input(s): CKTOTAL, CKMB, CKMBINDEX, TROPONINI in the last 168 hours. BNP: Invalid input(s): POCBNP CBG: No results for input(s): GLUCAP in the last 168 hours.  Recent Results (from the past 240 hour(s))  Wet prep, genital     Status: Abnormal   Collection Time: 02/23/14  8:15 PM  Result Value Ref Range Status   Yeast Wet Prep HPF POC NONE SEEN NONE SEEN Final   Trich, Wet Prep NONE SEEN NONE SEEN Final   Clue Cells Wet Prep HPF POC NONE SEEN NONE SEEN Final   WBC, Wet Prep HPF POC MODERATE (A) NONE SEEN Final     Studies:              All Imaging reviewed and is as per above notation   Scheduled Meds: . buPROPion  300 mg Oral Daily  . ciprofloxacin  400 mg Intravenous Q12H  . citalopram  10 mg Oral Daily  . fluticasone  1 puff Inhalation BID  . heparin  5,000 Units Subcutaneous 3 times per day  .  ibuprofen  400 mg Oral QID  . metronidazole  500 mg Intravenous Q8H  . norethindrone-ethinyl estradiol  1 tablet Oral Daily   Continuous Infusions: . sodium chloride 125 mL/hr at 02/24/14 0019     Assessment/Plan: 1. Unspecified colitis-as not having any diarrhea, cancel Stool Culture-continue empiric Cipro Flagyl IV, transitioned to by mouth clindamycin if able to take by mouth later today. CBC plus differential a.m.. 2. Headache continue Tylenol 650 every 6 when necessary 3. Bipolar continue citalopram 10 mg daily, the appropriate on 300 daily 4. Contraception/endometriosis continue norethindrone 1 tablet daily 5. Sinusitis continue fluticasone 1 twice a day   Code Status: Full Family Communication: Discussed with husband briefly Disposition Plan: Inpatient   Verneita Griffes,  MD  [facility] Pager [phone] 02/24/2014, 3:20 PM    LOS: 1 day

## 2014-02-25 DIAGNOSIS — R109 Unspecified abdominal pain: Secondary | ICD-10-CM

## 2014-02-25 LAB — CBC WITH DIFFERENTIAL/PLATELET
Basophils Absolute: 0 10*3/uL (ref 0.0–0.1)
Basophils Relative: 0 % (ref 0–1)
EOS ABS: 0.1 10*3/uL (ref 0.0–0.7)
Eosinophils Relative: 1 % (ref 0–5)
HEMATOCRIT: 29.1 % — AB (ref 36.0–46.0)
Hemoglobin: 9.6 g/dL — ABNORMAL LOW (ref 12.0–15.0)
LYMPHS ABS: 1.4 10*3/uL (ref 0.7–4.0)
Lymphocytes Relative: 24 % (ref 12–46)
MCH: 29.8 pg (ref 26.0–34.0)
MCHC: 33 g/dL (ref 30.0–36.0)
MCV: 90.4 fL (ref 78.0–100.0)
Monocytes Absolute: 0.6 10*3/uL (ref 0.1–1.0)
Monocytes Relative: 9 % (ref 3–12)
Neutro Abs: 3.8 10*3/uL (ref 1.7–7.7)
Neutrophils Relative %: 65 % (ref 43–77)
PLATELETS: 274 10*3/uL (ref 150–400)
RBC: 3.22 MIL/uL — ABNORMAL LOW (ref 3.87–5.11)
RDW: 13 % (ref 11.5–15.5)
WBC: 5.9 10*3/uL (ref 4.0–10.5)

## 2014-02-25 LAB — COMPREHENSIVE METABOLIC PANEL
ALBUMIN: 3 g/dL — AB (ref 3.5–5.2)
ALT: 12 U/L (ref 0–35)
AST: 11 U/L (ref 0–37)
Alkaline Phosphatase: 48 U/L (ref 39–117)
Anion gap: 9 (ref 5–15)
BILIRUBIN TOTAL: 0.3 mg/dL (ref 0.3–1.2)
BUN: 5 mg/dL — AB (ref 6–23)
CALCIUM: 8.3 mg/dL — AB (ref 8.4–10.5)
CO2: 24 mmol/L (ref 19–32)
Chloride: 105 mmol/L (ref 96–112)
Creatinine, Ser: 0.71 mg/dL (ref 0.50–1.10)
GLUCOSE: 105 mg/dL — AB (ref 70–99)
Potassium: 3.5 mmol/L (ref 3.5–5.1)
Sodium: 138 mmol/L (ref 135–145)
Total Protein: 6.1 g/dL (ref 6.0–8.3)

## 2014-02-25 MED ORDER — CLINDAMYCIN HCL 300 MG PO CAPS
300.0000 mg | ORAL_CAPSULE | Freq: Three times a day (TID) | ORAL | Status: DC
  Administered 2014-02-25: 300 mg via ORAL
  Filled 2014-02-25 (×4): qty 1

## 2014-02-25 MED ORDER — CLINDAMYCIN HCL 300 MG PO CAPS: 300.0000 mg | ORAL_CAPSULE | Freq: Three times a day (TID) | ORAL | Status: DC

## 2014-02-25 MED ORDER — IBUPROFEN 200 MG PO TABS
400.0000 mg | ORAL_TABLET | Freq: Once | ORAL | Status: AC
  Administered 2014-02-25: 400 mg via ORAL
  Filled 2014-02-25: qty 2

## 2014-02-26 LAB — GC/CHLAMYDIA PROBE AMP (~~LOC~~) NOT AT ARMC
CHLAMYDIA, DNA PROBE: NEGATIVE
Neisseria Gonorrhea: NEGATIVE

## 2014-02-28 ENCOUNTER — Telehealth: Payer: Self-pay | Admitting: Family Medicine

## 2014-02-28 NOTE — Telephone Encounter (Signed)
Caller name: Kalanie, Fewell Relation to pt: self  Call back number: [phone]   Reason for call:  Pt was discharged from [facility] 02/25/14. Hospital follow up scheduled with Dr. Etter Sjogren 03/15/14 at 11:30am (pt will be out of town next week)

## 2014-03-01 ENCOUNTER — Ambulatory Visit
Admission: RE | Admit: 2014-03-01 | Discharge: 2014-03-01 | Disposition: A | Discharge status: Home / Self Care | Payer: Managed Care, Other (non HMO) | Source: Ambulatory Visit | Attending: Obstetrics and Gynecology | Admitting: Obstetrics and Gynecology

## 2014-03-01 DIAGNOSIS — R928 Other abnormal and inconclusive findings on diagnostic imaging of breast: Secondary | ICD-10-CM

## 2014-03-06 NOTE — Discharge Summary (Signed)
Physician Discharge Summary  Angela Harrington VZC:[id number] DOB: 1970-07-02 DOA: 02/23/2014  PCP: Garnet Koyanagi, DO  Admit date: 02/23/2014 Discharge date: 03/06/2014  Time spent: 35 minutes  Recommendations for Outpatient Follow-up:  1. Needs close f/u C dr. Collene Mares of GI 2. Recommend PCP follow up soon  Discharge Diagnoses:  Principal Problem:   Colitis Active Problems:   Abdominal discomfort   Discharge Condition: Fair  Diet recommendation: soft and bland  Filed Weights   02/23/14 2330  Weight: 74.844 kg (165 lb)    History of present illness:  44 year old Caucasian female known history of hemorrhoids followed by Dr. Sonny Dandy colonoscopy 05/2013 completely normal, endometriosis 1995, on chronic OCPs, normally para, chronic sinusitis, admitted to the hospital 02/23/13 with abdominal pain starting on 02/19/14 which progressed to the point that she had unbearable pain , vomited food upon admission and came to the emergency room  She had diarrhea on presentation and placed on empiric Cipro/Flagyll Iv and then transitioned to PO clinda. I discussed the case with Dr. Benson Norway of GI who recommended checking serial CBC's.  As there was hemodynamic instability and no drop in Hemoglobin, it was thoguht her symptoms were mor ein keeping with a non-scpecific colits, and NOT A GI BLEED. She was d/c after she had been clinically stable and was instructed to f/u with  regular MD as OP  Consultations:  D/w Dr. Benson Norway of GI  Discharge Exam: Filed Vitals:   02/25/14 0546  BP: 123/77  Pulse: 80  Temp: 98.2 F (36.8 C)  Resp: 20    Discharge Instructions   Discharge Instructions    Diet - low sodium heart healthy    Complete by:  As directed      Discharge instructions    Complete by:  As directed   Complete ENTIRE course of Clindamycin I will cc Dr. Collene Mares regarding your recent GI concerns Return to the Hospital if you have  a]severe unremitting abd pain B]Fever or chills that will not go away      Increase activity slowly    Complete by:  As directed           Discharge Medication List as of 02/25/2014 12:16 PM    START taking these medications   Details  clindamycin (CLEOCIN) 300 MG capsule Take 1 capsule (300 mg total) by mouth every 8 (eight) hours., Starting 02/25/2014, Until Discontinued, Normal      CONTINUE these medications which have NOT CHANGED   Details  acetaminophen (TYLENOL) 500 MG tablet Take 1,500 mg by mouth every 6 (six) hours as needed for moderate pain., Until Discontinued, Historical Med    albuterol (PROAIR HFA) 108 (90 BASE) MCG/ACT inhaler Inhale 2 puffs into the lungs every 6 (six) hours as needed for wheezing or shortness of breath., Starting 09/28/2013, Until Discontinued, Normal    beclomethasone (QVAR) 40 MCG/ACT inhaler Inhale 2 puffs into the lungs 2 (two) times daily., Starting 09/28/2013, Until Discontinued, Normal    buPROPion (WELLBUTRIN XL) 300 MG 24 hr tablet TAKE 1 TABLET BY MOUTH EVERY DAY, Normal    citalopram (CELEXA) 10 MG tablet Take 1 tablet (10 mg total) by mouth daily. Office visit due now, Starting 02/05/2014, Until Discontinued, Normal    norethindrone-ethinyl estradiol (JUNEL FE,GILDESS FE,LOESTRIN FE) 1-20 MG-MCG tablet Take 1 tablet by mouth daily.  , Until Discontinued, Historical Med       No Known Allergies    The results of significant diagnostics from this hospitalization (including imaging, microbiology, ancillary  and laboratory) are listed below for reference.    Significant Diagnostic Studies: Ct Abdomen Pelvis W Contrast  02/23/2014   CLINICAL DATA:  Mid abdominal pain radiating to back, vomiting, history of endometriosis  EXAM: CT ABDOMEN AND PELVIS WITH CONTRAST  TECHNIQUE: Multidetector CT imaging of the abdomen and pelvis was performed using the standard protocol following bolus administration of intravenous contrast.  CONTRAST:  164mL OMNIPAQUE IOHEXOL 300 MG/ML  SOLN  COMPARISON:  None.  FINDINGS: Lower chest:   Lung bases are clear.  Hepatobiliary: Liver is notable for a 6 mm probable cyst in the inferior right hepatic lobe (series 2/ image 40), measuring simple fluid density on the coronal images.  Gallbladder is unremarkable.  Pancreas: Within normal limits.  Spleen: Within normal limits.  Adrenals/Urinary Tract: Adrenal glands are unremarkable.  Kidneys are within normal limits.  No hydronephrosis.  Bladder is within normal limits  Stomach/Bowel: Stomach is within normal limits.  No evidence of bowel obstruction.  Normal appendix.  Possible mass-like wall thickening involving the cecum (series 2/image 58), although poorly evaluated on the current CT.  Vascular/Lymphatic: No evidence of abdominal aortic aneurysm.  8 mm ileocolic node (series 2/image 54)  Otherwise, no suspicious abdominopelvic lymphadenopathy.  Reproductive: Uterus is unremarkable.  No adnexal masses.  Other: Small volume pelvic ascites.  Musculoskeletal: Visualized osseous structures are within normal limits.  IMPRESSION: No evidence of bowel obstruction.  Normal appendix.  Possible cecal wall thickening/mass, poorly evaluated. Colonoscopy is suggested to exclude underlying colonic neoplasm.  8 mm ileocolic node.   Electronically Signed   By: Julian Hy M.D.   On: 02/23/2014 18:39   Mm Digital Diagnostic Unilat R  03/01/2014   CLINICAL DATA:  The patient returns after screening study for evaluation of possible right breast asymmetry.  EXAM: DIGITAL DIAGNOSTIC RIGHT MAMMOGRAM WITH CAD  ULTRASOUND RIGHT BREAST  COMPARISON:  02/15/2014  ACR Breast Density Category c: The breast tissue is heterogeneously dense, which may obscure small masses.  FINDINGS: Additional views are performed showing no persistent abnormality in the upper portion of the right breast.  Mammographic images were processed with CAD.  On physical exam, I palpate no abnormality in the upper quadrants of the right breast.  Targeted ultrasound is performed, showing normal  appearing dense fibroglandular tissue throughout the upper quadrants of the right breast. Incidental note is made of a small circumscribed oval mass in the 8:30 o'clock location of the right breast 4 cm from the nipple. This measures 0.9 x 0.4 x 0.9 cm. Findings are consistent with benign fibroadenoma.  IMPRESSION: 1. No suspicious abnormality in the upper portion of the right breast. 2. Incidental probable fibroadenoma in the 830 o'clock location of the right breast. 3. We discussed management options including excision, biopsy, and close follow-up. Imaging followup is recommended at 6, 12, and 24 months to assess stability. The patient concurs with this plan.  RECOMMENDATION: Right breast ultrasound suggested in 6 months.  I have discussed the findings and recommendations with the patient. Results were also provided in writing at the conclusion of the visit. If applicable, a reminder letter will be sent to the patient regarding the next appointment.  BI-RADS CATEGORY  3: Probably benign.   Electronically Signed   By: Nolon Nations M.D.   On: 03/01/2014 10:30   US Breast Ltd Uni Right Inc Axilla  03/01/2014   CLINICAL DATA:  The patient returns after screening study for evaluation of possible right breast asymmetry.  EXAM: DIGITAL DIAGNOSTIC RIGHT  MAMMOGRAM WITH CAD  ULTRASOUND RIGHT BREAST  COMPARISON:  02/15/2014  ACR Breast Density Category c: The breast tissue is heterogeneously dense, which may obscure small masses.  FINDINGS: Additional views are performed showing no persistent abnormality in the upper portion of the right breast.  Mammographic images were processed with CAD.  On physical exam, I palpate no abnormality in the upper quadrants of the right breast.  Targeted ultrasound is performed, showing normal appearing dense fibroglandular tissue throughout the upper quadrants of the right breast. Incidental note is made of a small circumscribed oval mass in the 8:30 o'clock location of the right  breast 4 cm from the nipple. This measures 0.9 x 0.4 x 0.9 cm. Findings are consistent with benign fibroadenoma.  IMPRESSION: 1. No suspicious abnormality in the upper portion of the right breast. 2. Incidental probable fibroadenoma in the 830 o'clock location of the right breast. 3. We discussed management options including excision, biopsy, and close follow-up. Imaging followup is recommended at 6, 12, and 24 months to assess stability. The patient concurs with this plan.  RECOMMENDATION: Right breast ultrasound suggested in 6 months.  I have discussed the findings and recommendations with the patient. Results were also provided in writing at the conclusion of the visit. If applicable, a reminder letter will be sent to the patient regarding the next appointment.  BI-RADS CATEGORY  3: Probably benign.   Electronically Signed   By: Nolon Nations M.D.   On: 03/01/2014 10:30    Microbiology: No results found for this or any previous visit (from the past 240 hour(s)).   Labs: Basic Metabolic Panel: No results for input(s): NA, K, CL, CO2, GLUCOSE, BUN, CREATININE, CALCIUM, MG, PHOS in the last 168 hours. Liver Function Tests: No results for input(s): AST, ALT, ALKPHOS, BILITOT, PROT, ALBUMIN in the last 168 hours. No results for input(s): LIPASE, AMYLASE in the last 168 hours. No results for input(s): AMMONIA in the last 168 hours. CBC: No results for input(s): WBC, NEUTROABS, HGB, HCT, MCV, PLT in the last 168 hours. Cardiac Enzymes: No results for input(s): CKTOTAL, CKMB, CKMBINDEX, TROPONINI in the last 168 hours. BNP: BNP (last 3 results) No results for input(s): BNP in the last 8760 hours.  ProBNP (last 3 results) No results for input(s): PROBNP in the last 8760 hours.  CBG: No results for input(s): GLUCAP in the last 168 hours.     SignedNita Sells  [facility] 03/06/2014, 7:37 AM

## 2014-03-08 ENCOUNTER — Telehealth: Payer: Self-pay

## 2014-03-08 NOTE — Telephone Encounter (Signed)
  Admit date: 02/23/2014 Discharge date: 03/06/2014  Reason for admission: Colitis  Left a message for call back.

## 2014-03-08 NOTE — Telephone Encounter (Signed)
Admit date: 02/23/2014  Discharge date: 03/06/2014  Reason for admission:  Colitis  Recommendations for Outpatient Follow-up:  1. Needs close f/u C dr. Collene Mares of GI 2. Recommend PCP follow up soon  Transition Care Management Follow-up Telephone Call  How have you been since you were released from the hospital? Pt stated she's is doing much better.  No n/v/d or abdominal pain.  Tolerating food well.     Do you understand why you were in the hospital? yes   Do you understand the discharge instructions? yes  Items Reviewed:  Medications reviewed: yes  Allergies reviewed: yes  Dietary changes reviewed: yes  Referrals reviewed: yes, we discussed follow up with Dr. Collene Mares.  Pt stated she wanted to follow up with Dr. Etter Sjogren first.     Functional Questionnaire:   Activities of Daily Living (ADLs):   She states they are independent in the following: ambulation, bathing and hygiene, feeding, continence, grooming, toileting and dressing States they require assistance with the following: none   Any transportation issues/concerns?: no   Any patient concerns? no   Confirmed importance and date/time of follow-up visits scheduled: yes   Confirmed with patient if condition begins to worsen call PCP or go to the ER: yes   Hospital follow up appointment scheduled with Dr. Etter Sjogren on 03/15/14 @ 11:30 am.

## 2014-03-08 NOTE — Telephone Encounter (Signed)
Patient returned phone call Best # [phone]

## 2014-03-15 ENCOUNTER — Ambulatory Visit (INDEPENDENT_AMBULATORY_CARE_PROVIDER_SITE_OTHER): Payer: Managed Care, Other (non HMO) | Admitting: Family Medicine

## 2014-03-15 ENCOUNTER — Encounter: Payer: Self-pay | Admitting: Family Medicine

## 2014-03-15 VITALS — BP 121/75 | HR 90 | Temp 98.3°F | Wt 161.6 lb

## 2014-03-15 DIAGNOSIS — K529 Noninfective gastroenteritis and colitis, unspecified: Secondary | ICD-10-CM

## 2014-03-15 NOTE — Progress Notes (Signed)
Pre visit review using our clinic review tool, if applicable. No additional management support is needed unless otherwise documented below in the visit note. 

## 2014-03-15 NOTE — Progress Notes (Signed)
Subjective:    Patient ID: Angela Harrington, female    DOB: 1970-03-23, 44 y.o.   MRN: [id number]  HPI  Patient here for f/u from hospital for colitis/ gastritis.  She was discharged on 2/6 no 2/16 per pt.  She is feeling 100 % better.. Past Medical History  Diagnosis Date  . Anemia as a child  . Allergy     rhinitis  . Endometriosis 1995    Review of Systems  Constitutional: Positive for fatigue. Negative for activity change, appetite change and unexpected weight change.  Respiratory: Negative for cough and shortness of breath.   Cardiovascular: Negative for chest pain and palpitations.  Gastrointestinal: Negative for nausea, vomiting, abdominal pain, diarrhea, constipation, blood in stool, abdominal distention, anal bleeding and rectal pain.  Psychiatric/Behavioral: Negative for behavioral problems and dysphoric mood. The patient is not nervous/anxious.        Objective:    Physical Exam  Constitutional: She is oriented to person, place, and time. She appears well-developed and well-nourished. No distress.  HENT:  Right Ear: External ear normal.  Left Ear: External ear normal.  Nose: Nose normal.  Mouth/Throat: Oropharynx is clear and moist.  Eyes: EOM are normal. Pupils are equal, round, and reactive to light.  Neck: Normal range of motion. Neck supple.  Cardiovascular: Normal rate, regular rhythm and normal heart sounds.   No murmur heard. Pulmonary/Chest: Effort normal and breath sounds normal. No respiratory distress. She has no wheezes. She has no rales. She exhibits no tenderness.  Neurological: She is alert and oriented to person, place, and time.  Psychiatric: She has a normal mood and affect. Her behavior is normal. Judgment and thought content normal.    BP 121/75 mmHg  Pulse 90  Temp(Src) 98.3 F (36.8 C) (Oral)  Wt 161 lb 9.6 oz (73.301 kg)  SpO2 97%  LMP 02/28/2014 Wt Readings from Last 3 Encounters:  03/15/14 161 lb 9.6 oz (73.301 kg)  02/23/14 165 lb  (74.844 kg)  09/28/13 165 lb 2 oz (74.9 kg)     Lab Results  Component Value Date   WBC 5.9 02/25/2014   HGB 9.6* 02/25/2014   HCT 29.1* 02/25/2014   PLT 274 02/25/2014   GLUCOSE 105* 02/25/2014   CHOL 222* 11/18/2011   TRIG 58.0 11/18/2011   HDL 71.20 11/18/2011   LDLDIRECT 141.7 11/18/2011   ALT 12 02/25/2014   AST 11 02/25/2014   NA 138 02/25/2014   K 3.5 02/25/2014   CL 105 02/25/2014   CREATININE 0.71 02/25/2014   BUN 5* 02/25/2014   CO2 24 02/25/2014   TSH 0.92 11/18/2011    Mm Digital Diagnostic Unilat R  03/01/2014   CLINICAL DATA:  The patient returns after screening study for evaluation of possible right breast asymmetry.  EXAM: DIGITAL DIAGNOSTIC RIGHT MAMMOGRAM WITH CAD  ULTRASOUND RIGHT BREAST  COMPARISON:  02/15/2014  ACR Breast Density Category c: The breast tissue is heterogeneously dense, which may obscure small masses.  FINDINGS: Additional views are performed showing no persistent abnormality in the upper portion of the right breast.  Mammographic images were processed with CAD.  On physical exam, I palpate no abnormality in the upper quadrants of the right breast.  Targeted ultrasound is performed, showing normal appearing dense fibroglandular tissue throughout the upper quadrants of the right breast. Incidental note is made of a small circumscribed oval mass in the 8:30 o'clock location of the right breast 4 cm from the nipple. This measures 0.9 x 0.4  x 0.9 cm. Findings are consistent with benign fibroadenoma.  IMPRESSION: 1. No suspicious abnormality in the upper portion of the right breast. 2. Incidental probable fibroadenoma in the 830 o'clock location of the right breast. 3. We discussed management options including excision, biopsy, and close follow-up. Imaging followup is recommended at 6, 12, and 24 months to assess stability. The patient concurs with this plan.  RECOMMENDATION: Right breast ultrasound suggested in 6 months.  I have discussed the findings and  recommendations with the patient. Results were also provided in writing at the conclusion of the visit. If applicable, a reminder letter will be sent to the patient regarding the next appointment.  BI-RADS CATEGORY  3: Probably benign.   Electronically Signed   By: Nolon Nations M.D.   On: 03/01/2014 10:30   US Breast Ltd Uni Right Inc Axilla  03/01/2014   CLINICAL DATA:  The patient returns after screening study for evaluation of possible right breast asymmetry.  EXAM: DIGITAL DIAGNOSTIC RIGHT MAMMOGRAM WITH CAD  ULTRASOUND RIGHT BREAST  COMPARISON:  02/15/2014  ACR Breast Density Category c: The breast tissue is heterogeneously dense, which may obscure small masses.  FINDINGS: Additional views are performed showing no persistent abnormality in the upper portion of the right breast.  Mammographic images were processed with CAD.  On physical exam, I palpate no abnormality in the upper quadrants of the right breast.  Targeted ultrasound is performed, showing normal appearing dense fibroglandular tissue throughout the upper quadrants of the right breast. Incidental note is made of a small circumscribed oval mass in the 8:30 o'clock location of the right breast 4 cm from the nipple. This measures 0.9 x 0.4 x 0.9 cm. Findings are consistent with benign fibroadenoma.  IMPRESSION: 1. No suspicious abnormality in the upper portion of the right breast. 2. Incidental probable fibroadenoma in the 830 o'clock location of the right breast. 3. We discussed management options including excision, biopsy, and close follow-up. Imaging followup is recommended at 6, 12, and 24 months to assess stability. The patient concurs with this plan.  RECOMMENDATION: Right breast ultrasound suggested in 6 months.  I have discussed the findings and recommendations with the patient. Results were also provided in writing at the conclusion of the visit. If applicable, a reminder letter will be sent to the patient regarding the next  appointment.  BI-RADS CATEGORY  3: Probably benign.   Electronically Signed   By: Nolon Nations M.D.   On: 03/01/2014 10:30       Assessment & Plan:   Problem List Items Addressed This Visit    Colitis - Primary    Pt finished all abx All symptoms resolved F/u cpe       .   Garnet Koyanagi, DO

## 2014-03-15 NOTE — Patient Instructions (Signed)

## 2014-03-16 NOTE — Assessment & Plan Note (Signed)
Pt finished all abx All symptoms resolved F/u cpe

## 2014-03-20 NOTE — Telephone Encounter (Signed)
Pt seen by Dr. Etter Sjogren on 03/15/14.

## 2014-04-30 ENCOUNTER — Other Ambulatory Visit: Payer: Self-pay | Admitting: Family Medicine

## 2014-06-28 ENCOUNTER — Other Ambulatory Visit: Payer: Self-pay | Admitting: Family Medicine

## 2014-07-13 ENCOUNTER — Encounter: Payer: Self-pay | Admitting: Physician Assistant

## 2014-07-13 ENCOUNTER — Ambulatory Visit (INDEPENDENT_AMBULATORY_CARE_PROVIDER_SITE_OTHER): Payer: Managed Care, Other (non HMO) | Admitting: Physician Assistant

## 2014-07-13 VITALS — BP 124/70 | HR 86 | Temp 98.3°F | Ht 65.0 in | Wt 164.2 lb

## 2014-07-13 DIAGNOSIS — B9689 Other specified bacterial agents as the cause of diseases classified elsewhere: Secondary | ICD-10-CM

## 2014-07-13 DIAGNOSIS — J00 Acute nasopharyngitis [common cold]: Secondary | ICD-10-CM

## 2014-07-13 DIAGNOSIS — J208 Acute bronchitis due to other specified organisms: Principal | ICD-10-CM

## 2014-07-13 MED ORDER — BENZONATATE 200 MG PO CAPS: 200.0000 mg | ORAL_CAPSULE | Freq: Two times a day (BID) | ORAL | Status: DC | PRN

## 2014-07-13 MED ORDER — DOXYCYCLINE HYCLATE 100 MG PO CAPS: 100.0000 mg | ORAL_CAPSULE | Freq: Two times a day (BID) | ORAL | Status: DC

## 2014-07-13 NOTE — Progress Notes (Signed)
    Patient presents to clinic today c/o chest congestion and productive cough for 1 week after 2 weeks of sinus pressure, sinus pain.  Endorses new onset of fatigue.  Denies fever, chest pain, SOB, recent travel or sick contact. Has been taking chronic asthma medications as directed.  Past Medical History  Diagnosis Date  . Anemia as a child  . Allergy     rhinitis  . Endometriosis 1995    Current Outpatient Prescriptions on File Prior to Visit  Medication Sig Dispense Refill  . albuterol (PROAIR HFA) 108 (90 BASE) MCG/ACT inhaler Inhale 2 puffs into the lungs every 6 (six) hours as needed for wheezing or shortness of breath. 1 Inhaler 5  . beclomethasone (QVAR) 40 MCG/ACT inhaler Inhale 2 puffs into the lungs 2 (two) times daily. 1 Inhaler 12  . buPROPion (WELLBUTRIN XL) 300 MG 24 hr tablet TAKE 1 TABLET BY MOUTH EVERY DAY 90 tablet 1  . citalopram (CELEXA) 10 MG tablet TAKE 1 TABLET BY MOUTH EVERY DAY 90 tablet 1  . norethindrone-ethinyl estradiol (JUNEL FE,GILDESS FE,LOESTRIN FE) 1-20 MG-MCG tablet Take 1 tablet by mouth daily.       No current facility-administered medications on file prior to visit.    No Known Allergies  Family History  Problem Relation Age of Onset  . Arthritis Mother     osteoarthritis  . Hyperlipidemia Mother   . Alcohol abuse Father   . Arthritis Father     rheumatoid  . Hypertension Father   . Cancer Other     breast    History   Social History  . Marital Status: Married    Spouse Name: N/A  . Number of Children: 0  . Years of Education: N/A   Occupational History  . Law clerk    Social History Main Topics  . Smoking status: Never Smoker   . Smokeless tobacco: Never Used  . Alcohol Use: Yes     Comment: Socially  . Drug Use: No  . Sexual Activity: Not on file   Other Topics Concern  . None   Social History Narrative   Regular exercise- no   Review of Systems - See HPI.  All other ROS are negative.  BP 124/70 mmHg  Pulse 86   Temp(Src) 98.3 F (36.8 C) (Oral)  Ht 5\' 5"  (1.651 m)  Wt 164 lb 3.2 oz (74.481 kg)  BMI 27.32 kg/m2  SpO2 100%  LMP 06/22/2014  Physical Exam  Constitutional: She is oriented to person, place, and time and well-developed, well-nourished, and in no distress.  HENT:  Head: Normocephalic and atraumatic.  Eyes: Conjunctivae are normal.  Cardiovascular: Normal rate, regular rhythm, normal heart sounds and intact distal pulses.   Pulmonary/Chest: Effort normal and breath sounds normal. No respiratory distress. She has no wheezes. She has no rales. She exhibits no tenderness.  Neurological: She is alert and oriented to person, place, and time.  Skin: Skin is warm and dry. No rash noted.  Psychiatric: Affect normal.  Vitals reviewed.  Assessment/Plan: Acute bacterial bronchitis Rx Doxycycline.  Rx Tessalon for cough.  Continue chronic asthma medications. Humidifier, rest, hydration.  Follow-up PRN.

## 2014-07-13 NOTE — Progress Notes (Signed)
Pre visit review using our clinic review tool, if applicable. No additional management support is needed unless otherwise documented below in the visit note. 

## 2014-07-13 NOTE — Assessment & Plan Note (Signed)
Rx Doxycycline.  Rx Tessalon for cough.  Continue chronic asthma medications. Humidifier, rest, hydration.  Follow-up PRN.

## 2014-07-13 NOTE — Patient Instructions (Signed)
Please take the antibiotic as directed.  Stay well hydrated and get plenty of rest. Use the Tessalon as directed for cough.  Continue Mucinex. Place a humidifier in the bedroom. Call or return to clinic if symptoms are not improving.

## 2014-09-10 ENCOUNTER — Other Ambulatory Visit: Payer: Self-pay | Admitting: Obstetrics and Gynecology

## 2014-09-10 DIAGNOSIS — D241 Benign neoplasm of right breast: Secondary | ICD-10-CM

## 2014-09-10 DIAGNOSIS — N6489 Other specified disorders of breast: Secondary | ICD-10-CM

## 2014-09-14 ENCOUNTER — Ambulatory Visit
Admission: RE | Admit: 2014-09-14 | Discharge: 2014-09-14 | Disposition: A | Discharge status: Home / Self Care | Payer: Managed Care, Other (non HMO) | Source: Ambulatory Visit | Attending: Obstetrics and Gynecology | Admitting: Obstetrics and Gynecology

## 2014-09-14 DIAGNOSIS — D241 Benign neoplasm of right breast: Secondary | ICD-10-CM

## 2014-09-14 DIAGNOSIS — N6489 Other specified disorders of breast: Secondary | ICD-10-CM

## 2014-09-30 ENCOUNTER — Encounter (HOSPITAL_BASED_OUTPATIENT_CLINIC_OR_DEPARTMENT_OTHER): Payer: Self-pay

## 2014-09-30 ENCOUNTER — Emergency Department (HOSPITAL_BASED_OUTPATIENT_CLINIC_OR_DEPARTMENT_OTHER)
Admission: EM | Admit: 2014-09-30 | Discharge: 2014-09-30 | Disposition: A | Discharge status: Home / Self Care | Payer: Managed Care, Other (non HMO) | Attending: Emergency Medicine | Admitting: Emergency Medicine

## 2014-09-30 DIAGNOSIS — L0390 Cellulitis, unspecified: Secondary | ICD-10-CM

## 2014-09-30 DIAGNOSIS — Z23 Encounter for immunization: Secondary | ICD-10-CM | POA: Diagnosis not present

## 2014-09-30 DIAGNOSIS — Z79899 Other long term (current) drug therapy: Secondary | ICD-10-CM | POA: Diagnosis not present

## 2014-09-30 DIAGNOSIS — W228XXA Striking against or struck by other objects, initial encounter: Secondary | ICD-10-CM | POA: Diagnosis not present

## 2014-09-30 DIAGNOSIS — L03115 Cellulitis of right lower limb: Secondary | ICD-10-CM | POA: Insufficient documentation

## 2014-09-30 DIAGNOSIS — Y9301 Activity, walking, marching and hiking: Secondary | ICD-10-CM | POA: Insufficient documentation

## 2014-09-30 DIAGNOSIS — Z7951 Long term (current) use of inhaled steroids: Secondary | ICD-10-CM | POA: Diagnosis not present

## 2014-09-30 DIAGNOSIS — Y9289 Other specified places as the place of occurrence of the external cause: Secondary | ICD-10-CM | POA: Diagnosis not present

## 2014-09-30 DIAGNOSIS — S8991XA Unspecified injury of right lower leg, initial encounter: Secondary | ICD-10-CM | POA: Diagnosis present

## 2014-09-30 DIAGNOSIS — M795 Residual foreign body in soft tissue: Secondary | ICD-10-CM

## 2014-09-30 DIAGNOSIS — D649 Anemia, unspecified: Secondary | ICD-10-CM | POA: Insufficient documentation

## 2014-09-30 DIAGNOSIS — Y998 Other external cause status: Secondary | ICD-10-CM | POA: Insufficient documentation

## 2014-09-30 DIAGNOSIS — Z3202 Encounter for pregnancy test, result negative: Secondary | ICD-10-CM | POA: Insufficient documentation

## 2014-09-30 DIAGNOSIS — L02415 Cutaneous abscess of right lower limb: Secondary | ICD-10-CM | POA: Insufficient documentation

## 2014-09-30 DIAGNOSIS — Z8742 Personal history of other diseases of the female genital tract: Secondary | ICD-10-CM | POA: Diagnosis not present

## 2014-09-30 DIAGNOSIS — S81841A Puncture wound with foreign body, right lower leg, initial encounter: Secondary | ICD-10-CM | POA: Diagnosis not present

## 2014-09-30 DIAGNOSIS — L0291 Cutaneous abscess, unspecified: Secondary | ICD-10-CM

## 2014-09-30 DIAGNOSIS — Z792 Long term (current) use of antibiotics: Secondary | ICD-10-CM | POA: Diagnosis not present

## 2014-09-30 LAB — BASIC METABOLIC PANEL
ANION GAP: 9 (ref 5–15)
BUN: 11 mg/dL (ref 6–20)
CO2: 25 mmol/L (ref 22–32)
Calcium: 8.8 mg/dL — ABNORMAL LOW (ref 8.9–10.3)
Chloride: 104 mmol/L (ref 101–111)
Creatinine, Ser: 0.84 mg/dL (ref 0.44–1.00)
Glucose, Bld: 90 mg/dL (ref 65–99)
POTASSIUM: 3.5 mmol/L (ref 3.5–5.1)
SODIUM: 138 mmol/L (ref 135–145)

## 2014-09-30 LAB — CBC WITH DIFFERENTIAL/PLATELET
BASOS ABS: 0 10*3/uL (ref 0.0–0.1)
BASOS PCT: 0 % (ref 0–1)
EOS ABS: 0.1 10*3/uL (ref 0.0–0.7)
EOS PCT: 1 % (ref 0–5)
HCT: 37.3 % (ref 36.0–46.0)
Hemoglobin: 12.5 g/dL (ref 12.0–15.0)
Lymphocytes Relative: 23 % (ref 12–46)
Lymphs Abs: 2.4 10*3/uL (ref 0.7–4.0)
MCH: 29.8 pg (ref 26.0–34.0)
MCHC: 33.5 g/dL (ref 30.0–36.0)
MCV: 89 fL (ref 78.0–100.0)
Monocytes Absolute: 0.7 10*3/uL (ref 0.1–1.0)
Monocytes Relative: 7 % (ref 3–12)
NEUTROS PCT: 69 % (ref 43–77)
Neutro Abs: 7.2 10*3/uL (ref 1.7–7.7)
PLATELETS: 250 10*3/uL (ref 150–400)
RBC: 4.19 MIL/uL (ref 3.87–5.11)
RDW: 12.8 % (ref 11.5–15.5)
WBC: 10.3 10*3/uL (ref 4.0–10.5)

## 2014-09-30 LAB — PREGNANCY, URINE: PREG TEST UR: NEGATIVE

## 2014-09-30 MED ORDER — SODIUM CHLORIDE 0.9 % IV BOLUS (SEPSIS)
1000.0000 mL | Freq: Once | INTRAVENOUS | Status: AC
  Administered 2014-09-30: 1000 mL via INTRAVENOUS

## 2014-09-30 MED ORDER — TETANUS-DIPHTH-ACELL PERTUSSIS 5-2.5-18.5 LF-MCG/0.5 IM SUSP
0.5000 mL | Freq: Once | INTRAMUSCULAR | Status: AC
  Administered 2014-09-30: 0.5 mL via INTRAMUSCULAR
  Filled 2014-09-30: qty 0.5

## 2014-09-30 MED ORDER — CLINDAMYCIN PHOSPHATE 900 MG/50ML IV SOLN
900.0000 mg | Freq: Once | INTRAVENOUS | Status: AC
  Administered 2014-09-30: 900 mg via INTRAVENOUS
  Filled 2014-09-30: qty 50

## 2014-09-30 MED ORDER — CLINDAMYCIN HCL 150 MG PO CAPS: 450.0000 mg | ORAL_CAPSULE | Freq: Three times a day (TID) | ORAL | Status: DC

## 2014-09-30 MED ORDER — LIDOCAINE-EPINEPHRINE (PF) 2 %-1:200000 IJ SOLN
20.0000 mL | Freq: Once | INTRAMUSCULAR | Status: AC
  Administered 2014-09-30: 20 mL via INTRADERMAL
  Filled 2014-09-30: qty 20

## 2014-09-30 NOTE — ED Notes (Signed)
rx x 1 given for clindmycin

## 2014-09-30 NOTE — ED Provider Notes (Signed)
CSN: [id number]     Arrival date & time 09/30/14  1531 History   First MD Initiated Contact with Patient 09/30/14 1536     Chief Complaint  Patient presents with  . Foreign Body in Skin     (Consider location/radiation/quality/duration/timing/severity/associated sxs/prior Treatment) HPI   Blood pressure 119/68, pulse 80, temperature 98 F (36.7 C), temperature source Oral, resp. rate 16, height 5\' 6"  (1.676 m), weight 165 lb (74.844 kg), SpO2 98 %.  Angela Harrington is a 44 y.o. female complaining of foreign body lodged into right lateral lower right leg. Patient states she was walking through high grass and the stick went into the leg. She tried to remove it with a tweezers and feels that she got only part of it out. We 5 days ago. Since that time she's been camping and there is an area of redness surrounding the initial foreign body. Patient denies fever, chills, nausea, vomiting, streaking up the leg, pain, difficulty ambulating, diabetes, immunosuppression, chronic use.  Past Medical History  Diagnosis Date  . Anemia as a child  . Allergy     rhinitis  . Endometriosis 1995   Past Surgical History  Procedure Laterality Date  . Laporoscopy for endometriosis  1995  . Tonsillectomy  1979   Family History  Problem Relation Age of Onset  . Arthritis Mother     osteoarthritis  . Hyperlipidemia Mother   . Alcohol abuse Father   . Arthritis Father     rheumatoid  . Hypertension Father   . Cancer Other     breast   Social History  Substance Use Topics  . Smoking status: Never Smoker   . Smokeless tobacco: Never Used  . Alcohol Use: Yes     Comment: Socially   OB History    No data available     Review of Systems  10 systems reviewed and found to be negative, except as noted in the HPI.  Allergies  Review of patient's allergies indicates no known allergies.  Home Medications   Prior to Admission medications   Medication Sig Start Date End Date Taking? Authorizing  Provider  buPROPion (WELLBUTRIN XL) 300 MG 24 hr tablet TAKE 1 TABLET BY MOUTH EVERY DAY 06/28/14  Yes Alferd Apa Lowne, DO  citalopram (CELEXA) 10 MG tablet TAKE 1 TABLET BY MOUTH EVERY DAY 05/01/14  Yes Rosalita Chessman, DO  norethindrone-ethinyl estradiol (JUNEL FE,GILDESS FE,LOESTRIN FE) 1-20 MG-MCG tablet Take 1 tablet by mouth daily.     Yes Historical Provider, MD  albuterol (PROAIR HFA) 108 (90 BASE) MCG/ACT inhaler Inhale 2 puffs into the lungs every 6 (six) hours as needed for wheezing or shortness of breath. 09/28/13   Rosalita Chessman, DO  beclomethasone (QVAR) 40 MCG/ACT inhaler Inhale 2 puffs into the lungs 2 (two) times daily. 09/28/13   Rosalita Chessman, DO  benzonatate (TESSALON) 200 MG capsule Take 1 capsule (200 mg total) by mouth 2 (two) times daily as needed for cough. 07/13/14   Brunetta Jeans, PA-C  doxycycline (VIBRAMYCIN) 100 MG capsule Take 1 capsule (100 mg total) by mouth 2 (two) times daily. 07/13/14   Brunetta Jeans, PA-C   BP 119/68 mmHg  Pulse 80  Temp(Src) 98 F (36.7 C) (Oral)  Resp 16  Ht 5\' 6"  (1.676 m)  Wt 165 lb (74.844 kg)  BMI 26.64 kg/m2  SpO2 98% Physical Exam  Constitutional: She is oriented to person, place, and time. She appears well-developed and well-nourished. No  distress.  HENT:  Head: Normocephalic and atraumatic.  Mouth/Throat: Oropharynx is clear and moist.  Eyes: Conjunctivae and EOM are normal. Pupils are equal, round, and reactive to light.  Cardiovascular: Normal rate, regular rhythm and intact distal pulses.   Pulmonary/Chest: Effort normal and breath sounds normal. No stridor.  Abdominal: Soft. Bowel sounds are normal.  Musculoskeletal: Normal range of motion.  Neurological: She is alert and oriented to person, place, and time.  Skin:  13 x 15 cm area of cellulitis with central puncture wound as diagrammed, no focal fluctuance or active drainage  Psychiatric: She has a normal mood and affect.  Nursing note and vitals  reviewed.       ED Course  FOREIGN BODY REMOVAL Date/Time: 09/30/2014 5:11 PM Performed by: Monico Blitz Authorized by: Monico Blitz Consent: Verbal consent obtained. Consent given by: patient Patient identity confirmed: verbally with patient Body area: skin General location: lower extremity Location details: right lower leg Anesthesia: local infiltration Local anesthetic: lidocaine 2% with epinephrine Anesthetic total: 5 ml Patient sedated: no Patient restrained: no Localization method: visualized Removal mechanism: forceps Dressing: antibiotic ointment Tendon involvement: none Depth: deep Complexity: complex 1 objects recovered. Objects recovered: 3 x80mm wood fragment Post-procedure assessment: foreign body removed Patient tolerance: Patient tolerated the procedure well with no immediate complications Comments: 1.5 cm incision is made with expression of a mild amount of purulent material.   (including critical care time) Labs Review Labs Reviewed - No data to display  Imaging Review No results found. I have personally reviewed and evaluated these images and lab results as part of my medical decision-making.   EKG Interpretation None      MDM   Final diagnoses:  None    Filed Vitals:   09/30/14 1534  BP: 119/68  Pulse: 80  Temp: 98 F (36.7 C)  TempSrc: Oral  Resp: 16  Height: 5\' 6"  (1.676 m)  Weight: 165 lb (74.844 kg)  SpO2: 98%    Medications  sodium chloride 0.9 % bolus 1,000 mL (1,000 mLs Intravenous New Bag/Given 09/30/14 1600)  clindamycin (CLEOCIN) IVPB 900 mg (0 mg Intravenous Stopped 09/30/14 1638)  lidocaine-EPINEPHrine (XYLOCAINE W/EPI) 2 %-1:200000 (PF) injection 20 mL (20 mLs Intradermal Given by Other 09/30/14 1601)  Tdap (BOOSTRIX) injection 0.5 mL (0.5 mLs Intramuscular Given 09/30/14 1559)    Angela Harrington is a pleasant 44 y.o. female presenting with cellulitis to right lower extremity. Patient with no signs of systemic  infection, she is afebrile and pain is minimal. Patient had organic matter in the form of the stick impact the foot several days ago. She then tried to remove it but it was incomplete. Likely the source of the infection. Incision was made and a piece of wood was removed. There was also a small amount of purulent drainage. Wound is left open. Patient received 900 mg of clindamycin IV. Blood work reassuring with no leukocytosis. Patient will be started on oral antibiotics. Area of cellulitis as outlined we've had an extensive discussion of wound care and return precautions.  Evaluation does not show pathology that woforfuld require ongoing emergent intervention or inpatient treatment. Pt is hemodynamically stable and mentating appropriately. Discussed findings and plan with patient/guardian, who agrees with care plan. All questions answered. Return precautions discussed and outpatient follow up given.   New Prescriptions   CLINDAMYCIN (CLEOCIN) 150 MG CAPSULE    Take 3 capsules (450 mg total) by mouth 3 (three) times daily.  Monico Blitz, PA-C 09/30/14 [place], MD 10/03/14 [phone]

## 2014-09-30 NOTE — ED Notes (Signed)
Pt reports got a piece of splinter in right lower leg, reports trying to get it out but now it wont come out.  Pt has scabbed area with cellulitis around scab.

## 2014-09-30 NOTE — Discharge Instructions (Signed)
If you develop fever, have vomiting or if the swelling and redness starts spreading , return to the emergency room immediately for a recheck.  Take your antibiotics as directed and to completion. You should never have any leftover antibiotics! Push fluids and stay well hydrated.   Any antibiotic use can reduce the efficacy of hormonal birth control. Please use back up method of contraception.   Please follow with your primary care doctor in the next 2 days for a check-up. They must obtain records for further management.   Do not hesitate to return to the Emergency Department for any new, worsening or concerning symptoms.   Cellulitis Cellulitis is an infection of the skin and the tissue beneath it. The infected area is usually red and tender. Cellulitis occurs most often in the arms and lower legs.  CAUSES  Cellulitis is caused by bacteria that enter the skin through cracks or cuts in the skin. The most common types of bacteria that cause cellulitis are staphylococci and streptococci. SIGNS AND SYMPTOMS   Redness and warmth.  Swelling.  Tenderness or pain.  Fever. DIAGNOSIS  Your health care provider can usually determine what is wrong based on a physical exam. Blood tests may also be done. TREATMENT  Treatment usually involves taking an antibiotic medicine. HOME CARE INSTRUCTIONS   Take your antibiotic medicine as directed by your health care provider. Finish the antibiotic even if you start to feel better.  Keep the infected arm or leg elevated to reduce swelling.  Apply a warm cloth to the affected area up to 4 times per day to relieve pain.  Take medicines only as directed by your health care provider.  Keep all follow-up visits as directed by your health care provider. SEEK MEDICAL CARE IF:   You notice red streaks coming from the infected area.  Your red area gets larger or turns dark in color.  Your bone or joint underneath the infected area becomes painful after  the skin has healed.  Your infection returns in the same area or another area.  You notice a swollen bump in the infected area.  You develop new symptoms.  You have a fever. SEEK IMMEDIATE MEDICAL CARE IF:   You feel very sleepy.  You develop vomiting or diarrhea.  You have a general ill feeling (malaise) with muscle aches and pains. MAKE SURE YOU:   Understand these instructions.  Will watch your condition.  Will get help right away if you are not doing well or get worse. Document Released: 10/15/2004 Document Revised: 05/22/2013 Document Reviewed: 03/23/2011 [facility] Patient Information 2015 [place], [place]. This information is not intended to replace advice given to you by your health care provider. Make sure you discuss any questions you have with your health care provider.

## 2014-10-18 ENCOUNTER — Other Ambulatory Visit: Payer: Self-pay | Admitting: Family Medicine

## 2014-12-01 ENCOUNTER — Other Ambulatory Visit: Payer: Self-pay | Admitting: Family Medicine

## 2015-01-09 ENCOUNTER — Other Ambulatory Visit: Payer: Self-pay | Admitting: Family Medicine

## 2015-03-07 ENCOUNTER — Other Ambulatory Visit: Payer: Self-pay | Admitting: Family Medicine

## 2015-03-08 NOTE — Telephone Encounter (Signed)
Called patient to inform her of below. Left message for her to call the office to schedule an appointment with Dr. Etter Sjogren

## 2015-03-08 NOTE — Telephone Encounter (Signed)
Please schedule an apt for this patient. She has not been seen in year.    KP

## 2015-03-13 ENCOUNTER — Other Ambulatory Visit: Payer: Self-pay

## 2015-03-13 MED ORDER — CITALOPRAM HYDROBROMIDE 10 MG PO TABS: 10.0000 mg | ORAL_TABLET | Freq: Every day | ORAL | Status: DC

## 2015-04-06 ENCOUNTER — Other Ambulatory Visit: Payer: Self-pay | Admitting: Family Medicine

## 2015-04-08 ENCOUNTER — Other Ambulatory Visit: Payer: Self-pay | Admitting: Obstetrics and Gynecology

## 2015-04-08 DIAGNOSIS — N6489 Other specified disorders of breast: Secondary | ICD-10-CM

## 2015-04-08 MED ORDER — CITALOPRAM HYDROBROMIDE 10 MG PO TABS: 10.0000 mg | ORAL_TABLET | Freq: Every day | ORAL | Status: DC

## 2015-04-08 NOTE — Telephone Encounter (Signed)
Please schedule the patient a follow for medication management and forward back to me. Thanks     KP

## 2015-04-08 NOTE — Telephone Encounter (Signed)
Pt says that she scheduled an medication fu. Pt would like to know if she can have a refill on medication.    1. Citalopram  2. buPROPion   Pharmacy : [id number] - [place], [place]

## 2015-04-08 NOTE — Telephone Encounter (Signed)
Left message for patient to call the office and schedule medication follow up

## 2015-04-12 ENCOUNTER — Ambulatory Visit
Admission: RE | Admit: 2015-04-12 | Discharge: 2015-04-12 | Disposition: A | Discharge status: Home / Self Care | Payer: Managed Care, Other (non HMO) | Source: Ambulatory Visit | Attending: Obstetrics and Gynecology | Admitting: Obstetrics and Gynecology

## 2015-04-12 DIAGNOSIS — N6489 Other specified disorders of breast: Secondary | ICD-10-CM

## 2015-04-30 ENCOUNTER — Encounter: Payer: Self-pay | Admitting: Family Medicine

## 2015-04-30 ENCOUNTER — Ambulatory Visit (INDEPENDENT_AMBULATORY_CARE_PROVIDER_SITE_OTHER): Payer: Managed Care, Other (non HMO) | Admitting: Family Medicine

## 2015-04-30 VITALS — BP 130/60 | HR 109 | Temp 98.8°F | Ht 68.0 in | Wt 160.8 lb

## 2015-04-30 DIAGNOSIS — F329 Major depressive disorder, single episode, unspecified: Secondary | ICD-10-CM

## 2015-04-30 NOTE — Progress Notes (Signed)
Pre visit review using our clinic review tool, if applicable. No additional management support is needed unless otherwise documented below in the visit note. 

## 2015-05-02 MED ORDER — BUPROPION HCL ER (XL) 300 MG PO TB24: 300.0000 mg | ORAL_TABLET | Freq: Every day | ORAL | Status: DC

## 2015-05-02 MED ORDER — CITALOPRAM HYDROBROMIDE 10 MG PO TABS: 10.0000 mg | ORAL_TABLET | Freq: Every day | ORAL | Status: DC

## 2015-05-02 NOTE — Progress Notes (Signed)
Patient ID: Angela Harrington, female    DOB: 1970/09/10  Age: 45 y.o. MRN: [id number]    Subjective:  Subjective HPI Angela Harrington presents for f/u depression.    Review of Systems  Constitutional: Negative for diaphoresis, appetite change, fatigue and unexpected weight change.  Eyes: Negative for pain, redness and visual disturbance.  Respiratory: Negative for cough, chest tightness, shortness of breath and wheezing.   Cardiovascular: Negative for chest pain, palpitations and leg swelling.  Endocrine: Negative for cold intolerance, heat intolerance, polydipsia, polyphagia and polyuria.  Genitourinary: Negative for dysuria, frequency and difficulty urinating.  Neurological: Negative for dizziness, light-headedness, numbness and headaches.    History Past Medical History  Diagnosis Date  . Anemia as a child  . Allergy     rhinitis  . Endometriosis 1995    She has past surgical history that includes laporoscopy for endometriosis (1995) and Tonsillectomy (1979).   Her family history includes Alcohol abuse in her father; Arthritis in her father and mother; Cancer in her other; Hyperlipidemia in her mother; Hypertension in her father.She reports that she has never smoked. She has never used smokeless tobacco. She reports that she drinks alcohol. She reports that she does not use illicit drugs.  Current Outpatient Prescriptions on File Prior to Visit  Medication Sig Dispense Refill  . albuterol (PROAIR HFA) 108 (90 BASE) MCG/ACT inhaler Inhale 2 puffs into the lungs every 6 (six) hours as needed for wheezing or shortness of breath. 1 Inhaler 5  . norethindrone-ethinyl estradiol (JUNEL FE,GILDESS FE,LOESTRIN FE) 1-20 MG-MCG tablet Take 1 tablet by mouth daily.      Marland Kitchen QVAR 40 MCG/ACT inhaler INHALE 2 PUFFS BY MOUTH TWICE DAILY 8.7 g 0   No current facility-administered medications on file prior to visit.     Objective:  Objective Physical Exam  Constitutional: She is oriented to  person, place, and time. She appears well-developed and well-nourished.  HENT:  Head: Normocephalic and atraumatic.  Eyes: Conjunctivae and EOM are normal.  Neck: Normal range of motion. Neck supple. No JVD present. Carotid bruit is not present. No thyromegaly present.  Cardiovascular: Normal rate, regular rhythm and normal heart sounds.   No murmur heard. Pulmonary/Chest: Effort normal and breath sounds normal. No respiratory distress. She has no wheezes. She has no rales. She exhibits no tenderness.  Musculoskeletal: She exhibits no edema.  Neurological: She is alert and oriented to person, place, and time.  Psychiatric: She has a normal mood and affect. Her behavior is normal.  Nursing note and vitals reviewed.  BP 130/60 mmHg  Pulse 109  Temp(Src) 98.8 F (37.1 C) (Oral)  Ht 5\' 8"  (1.727 m)  Wt 160 lb 12.8 oz (72.938 kg)  BMI 24.46 kg/m2  SpO2 98% Wt Readings from Last 3 Encounters:  04/30/15 160 lb 12.8 oz (72.938 kg)  09/30/14 165 lb (74.844 kg)  07/13/14 164 lb 3.2 oz (74.481 kg)     Lab Results  Component Value Date   WBC 10.3 09/30/2014   HGB 12.5 09/30/2014   HCT 37.3 09/30/2014   PLT 250 09/30/2014   GLUCOSE 90 09/30/2014   CHOL 222* 11/18/2011   TRIG 58.0 11/18/2011   HDL 71.20 11/18/2011   LDLDIRECT 141.7 11/18/2011   ALT 12 02/25/2014   AST 11 02/25/2014   NA 138 09/30/2014   K 3.5 09/30/2014   CL 104 09/30/2014   CREATININE 0.84 09/30/2014   BUN 11 09/30/2014   CO2 25 09/30/2014   TSH 0.92 11/18/2011  US Breast Ltd Uni Right Inc Axilla  04/12/2015  CLINICAL DATA:  45 year old female for annual bilateral mammogram and followup right breast mass. EXAM: DIGITAL DIAGNOSTIC BILATERAL MAMMOGRAM WITH 3D TOMOSYNTHESIS AND CAD ULTRASOUND RIGHT BREAST COMPARISON:  Prior mammograms and ultrasounds dating back to 03/01/2014 ACR Breast Density Category c: The breast tissue is heterogeneously dense, which may obscure small masses. FINDINGS: 2D and 3D full field  views of the right breast demonstrate an unchanged circumscribed oval mass within the outer right breast. No new mass, distortion or worrisome calcifications are noted. Mammographic images were processed with CAD. Targeted ultrasound is performed, showing a stable 0.8 x 0.4 x 0.8 cm circumscribed oval hypoechoic parallel mass at the 8:30 position of the right breast 4 cm from the nipple, likely a fibroadenoma. IMPRESSION: Stable 0.8 x 0.4 x 0.8 cm probable fibroadenoma in the outer right breast. Twelve month followup is recommended to ensure 2 year stability. No mammographic evidence of breast malignancy otherwise. RECOMMENDATION: Bilateral diagnostic mammograms with right breast ultrasound in 12 months. I have discussed the findings and recommendations with the patient. Results were also provided in writing at the conclusion of the visit. If applicable, a reminder letter will be sent to the patient regarding the next appointment. BI-RADS CATEGORY  3: Probably benign. Electronically Signed   By: Margarette Canada M.D.   On: 04/12/2015 11:18   Mm Diag Breast Tomo Bilateral  04/12/2015  CLINICAL DATA:  45 year old female for annual bilateral mammogram and followup right breast mass. EXAM: DIGITAL DIAGNOSTIC BILATERAL MAMMOGRAM WITH 3D TOMOSYNTHESIS AND CAD ULTRASOUND RIGHT BREAST COMPARISON:  Prior mammograms and ultrasounds dating back to 03/01/2014 ACR Breast Density Category c: The breast tissue is heterogeneously dense, which may obscure small masses. FINDINGS: 2D and 3D full field views of the right breast demonstrate an unchanged circumscribed oval mass within the outer right breast. No new mass, distortion or worrisome calcifications are noted. Mammographic images were processed with CAD. Targeted ultrasound is performed, showing a stable 0.8 x 0.4 x 0.8 cm circumscribed oval hypoechoic parallel mass at the 8:30 position of the right breast 4 cm from the nipple, likely a fibroadenoma. IMPRESSION: Stable 0.8 x 0.4  x 0.8 cm probable fibroadenoma in the outer right breast. Twelve month followup is recommended to ensure 2 year stability. No mammographic evidence of breast malignancy otherwise. RECOMMENDATION: Bilateral diagnostic mammograms with right breast ultrasound in 12 months. I have discussed the findings and recommendations with the patient. Results were also provided in writing at the conclusion of the visit. If applicable, a reminder letter will be sent to the patient regarding the next appointment. BI-RADS CATEGORY  3: Probably benign. Electronically Signed   By: Margarette Canada M.D.   On: 04/12/2015 11:18     Assessment & Plan:  Plan I have discontinued Ms. Yaffe's doxycycline, benzonatate, and clindamycin. I have also changed her buPROPion. Additionally, I am having her maintain her norethindrone-ethinyl estradiol, albuterol, QVAR, and citalopram.  Meds ordered this encounter  Medications  . citalopram (CELEXA) 10 MG tablet    Sig: Take 1 tablet (10 mg total) by mouth daily.    Dispense:  90 tablet    Refill:  0  . buPROPion (WELLBUTRIN XL) 300 MG 24 hr tablet    Sig: Take 1 tablet (300 mg total) by mouth daily.    Dispense:  90 tablet    Refill:  0    Problem List Items Addressed This Visit    None    Visit  Diagnoses    Depression    -  Primary    Relevant Medications    citalopram (CELEXA) 10 MG tablet    buPROPion (WELLBUTRIN XL) 300 MG 24 hr tablet       Follow-up: Return if symptoms worsen or fail to improve.  Ann Held, DO

## 2015-05-06 ENCOUNTER — Encounter: Payer: Self-pay | Admitting: Family Medicine

## 2015-05-06 ENCOUNTER — Ambulatory Visit (INDEPENDENT_AMBULATORY_CARE_PROVIDER_SITE_OTHER): Payer: Managed Care, Other (non HMO) | Admitting: Family Medicine

## 2015-05-06 VITALS — BP 110/70 | HR 117 | Temp 98.8°F | Ht 68.0 in | Wt 161.0 lb

## 2015-05-06 DIAGNOSIS — F32A Depression, unspecified: Secondary | ICD-10-CM

## 2015-05-06 DIAGNOSIS — F329 Major depressive disorder, single episode, unspecified: Secondary | ICD-10-CM | POA: Diagnosis not present

## 2015-05-06 MED ORDER — BUPROPION HCL ER (XL) 300 MG PO TB24: 300.0000 mg | ORAL_TABLET | Freq: Every day | ORAL | Status: DC

## 2015-05-06 MED ORDER — CITALOPRAM HYDROBROMIDE 10 MG PO TABS: 10.0000 mg | ORAL_TABLET | Freq: Every day | ORAL | Status: DC

## 2015-05-06 NOTE — Progress Notes (Signed)
Patient ID: Angela Harrington, female    DOB: 05-30-1970  Age: 45 y.o. MRN: [id number]    Subjective:  Subjective HPI Angela Harrington presents for f/u depression.  No complaints.   Review of Systems  Constitutional: Negative for diaphoresis, appetite change, fatigue and unexpected weight change.  Eyes: Negative for pain, redness and visual disturbance.  Respiratory: Negative for cough, chest tightness, shortness of breath and wheezing.   Cardiovascular: Negative for chest pain, palpitations and leg swelling.  Endocrine: Negative for cold intolerance, heat intolerance, polydipsia, polyphagia and polyuria.  Genitourinary: Negative for dysuria, frequency and difficulty urinating.  Neurological: Negative for dizziness, light-headedness, numbness and headaches.    History Past Medical History  Diagnosis Date  . Anemia as a child  . Allergy     rhinitis  . Endometriosis 1995    She has past surgical history that includes laporoscopy for endometriosis (1995) and Tonsillectomy (1979).   Her family history includes Alcohol abuse in her father; Arthritis in her father and mother; Cancer in her other; Hyperlipidemia in her mother; Hypertension in her father.She reports that she has never smoked. She has never used smokeless tobacco. She reports that she drinks alcohol. She reports that she does not use illicit drugs.  Current Outpatient Prescriptions on File Prior to Visit  Medication Sig Dispense Refill  . albuterol (PROAIR HFA) 108 (90 BASE) MCG/ACT inhaler Inhale 2 puffs into the lungs every 6 (six) hours as needed for wheezing or shortness of breath. 1 Inhaler 5  . norethindrone-ethinyl estradiol (JUNEL FE,GILDESS FE,LOESTRIN FE) 1-20 MG-MCG tablet Take 1 tablet by mouth daily.      Marland Kitchen QVAR 40 MCG/ACT inhaler INHALE 2 PUFFS BY MOUTH TWICE DAILY 8.7 g 0   No current facility-administered medications on file prior to visit.     Objective:  Objective Physical Exam  Constitutional: She is  oriented to person, place, and time. She appears well-developed and well-nourished.  HENT:  Head: Normocephalic and atraumatic.  Eyes: Conjunctivae and EOM are normal.  Neck: Normal range of motion. Neck supple. No JVD present. Carotid bruit is not present. No thyromegaly present.  Cardiovascular: Normal rate, regular rhythm and normal heart sounds.   No murmur heard. Pulmonary/Chest: Effort normal and breath sounds normal. No respiratory distress. She has no wheezes. She has no rales. She exhibits no tenderness.  Musculoskeletal: She exhibits no edema.  Neurological: She is alert and oriented to person, place, and time.  Psychiatric: She has a normal mood and affect.  Nursing note and vitals reviewed.  BP 110/70 mmHg  Pulse 117  Temp(Src) 98.8 F (37.1 C) (Oral)  Ht 5\' 8"  (1.727 m)  Wt 161 lb (73.029 kg)  BMI 24.49 kg/m2  SpO2 98% Wt Readings from Last 3 Encounters:  05/06/15 161 lb (73.029 kg)  04/30/15 160 lb 12.8 oz (72.938 kg)  09/30/14 165 lb (74.844 kg)     Lab Results  Component Value Date   WBC 10.3 09/30/2014   HGB 12.5 09/30/2014   HCT 37.3 09/30/2014   PLT 250 09/30/2014   GLUCOSE 90 09/30/2014   CHOL 222* 11/18/2011   TRIG 58.0 11/18/2011   HDL 71.20 11/18/2011   LDLDIRECT 141.7 11/18/2011   ALT 12 02/25/2014   AST 11 02/25/2014   NA 138 09/30/2014   K 3.5 09/30/2014   CL 104 09/30/2014   CREATININE 0.84 09/30/2014   BUN 11 09/30/2014   CO2 25 09/30/2014   TSH 0.92 11/18/2011    [facility]  [place]  04/12/2015  CLINICAL DATA:  45 year old female for annual bilateral mammogram and followup right breast mass. EXAM: DIGITAL DIAGNOSTIC BILATERAL MAMMOGRAM WITH 3D TOMOSYNTHESIS AND CAD ULTRASOUND RIGHT BREAST COMPARISON:  Prior mammograms and ultrasounds dating back to 03/01/2014 ACR Breast Density Category c: The breast tissue is heterogeneously dense, which may obscure small masses. FINDINGS: 2D and 3D full field views of the right breast  demonstrate an unchanged circumscribed oval mass within the outer right breast. No new mass, distortion or worrisome calcifications are noted. Mammographic images were processed with CAD. Targeted ultrasound is performed, showing a stable 0.8 x 0.4 x 0.8 cm circumscribed oval hypoechoic parallel mass at the 8:30 position of the right breast 4 cm from the nipple, likely a fibroadenoma. IMPRESSION: Stable 0.8 x 0.4 x 0.8 cm probable fibroadenoma in the outer right breast. Twelve month followup is recommended to ensure 2 year stability. No mammographic evidence of breast malignancy otherwise. RECOMMENDATION: Bilateral diagnostic mammograms with right breast ultrasound in 12 months. I have discussed the findings and recommendations with the patient. Results were also provided in writing at the conclusion of the visit. If applicable, a reminder letter will be sent to the patient regarding the next appointment. BI-RADS CATEGORY  3: Probably benign. Electronically Signed   By: Margarette Canada M.D.   On: 04/12/2015 11:18   Mm Diag Breast Tomo Bilateral  04/12/2015  CLINICAL DATA:  45 year old female for annual bilateral mammogram and followup right breast mass. EXAM: DIGITAL DIAGNOSTIC BILATERAL MAMMOGRAM WITH 3D TOMOSYNTHESIS AND CAD ULTRASOUND RIGHT BREAST COMPARISON:  Prior mammograms and ultrasounds dating back to 03/01/2014 ACR Breast Density Category c: The breast tissue is heterogeneously dense, which may obscure small masses. FINDINGS: 2D and 3D full field views of the right breast demonstrate an unchanged circumscribed oval mass within the outer right breast. No new mass, distortion or worrisome calcifications are noted. Mammographic images were processed with CAD. Targeted ultrasound is performed, showing a stable 0.8 x 0.4 x 0.8 cm circumscribed oval hypoechoic parallel mass at the 8:30 position of the right breast 4 cm from the nipple, likely a fibroadenoma. IMPRESSION: Stable 0.8 x 0.4 x 0.8 cm probable  fibroadenoma in the outer right breast. Twelve month followup is recommended to ensure 2 year stability. No mammographic evidence of breast malignancy otherwise. RECOMMENDATION: Bilateral diagnostic mammograms with right breast ultrasound in 12 months. I have discussed the findings and recommendations with the patient. Results were also provided in writing at the conclusion of the visit. If applicable, a reminder letter will be sent to the patient regarding the next appointment. BI-RADS CATEGORY  3: Probably benign. Electronically Signed   By: Margarette Canada M.D.   On: 04/12/2015 11:18     Assessment & Plan:  Plan I am having Ms. Marchuk maintain her norethindrone-ethinyl estradiol, albuterol, QVAR, buPROPion, and citalopram.  Meds ordered this encounter  Medications  . buPROPion (WELLBUTRIN XL) 300 MG 24 hr tablet    Sig: Take 1 tablet (300 mg total) by mouth daily.    Dispense:  90 tablet    Refill:  3  . citalopram (CELEXA) 10 MG tablet    Sig: Take 1 tablet (10 mg total) by mouth daily.    Dispense:  90 tablet    Refill:  3    Problem List Items Addressed This Visit    None    Visit Diagnoses    Depression    -  Primary    Relevant Medications  buPROPion (WELLBUTRIN XL) 300 MG 24 hr tablet    citalopram (CELEXA) 10 MG tablet       Follow-up: Return in about 1 year (around 05/05/2016), or if symptoms worsen or fail to improve, for annual exam, fasting.  Ann Held, DO

## 2015-05-06 NOTE — Patient Instructions (Signed)
Major Depressive Disorder Major depressive disorder is a mental illness. It also may be called clinical depression or unipolar depression. Major depressive disorder usually causes feelings of sadness, hopelessness, or helplessness. Some people with this disorder do not feel particularly sad but lose interest in doing things they used to enjoy (anhedonia). Major depressive disorder also can cause physical symptoms. It can interfere with work, school, relationships, and other normal everyday activities. The disorder varies in severity but is longer lasting and more serious than the sadness we all feel from time to time in our lives. Major depressive disorder often is triggered by stressful life events or major life changes. Examples of these triggers include divorce, loss of your job or home, a move, and the death of a family member or close friend. Sometimes this disorder occurs for no obvious reason at all. People who have family members with major depressive disorder or bipolar disorder are at higher risk for developing this disorder, with or without life stressors. Major depressive disorder can occur at any age. It may occur just once in your life (single episode major depressive disorder). It may occur multiple times (recurrent major depressive disorder). SYMPTOMS People with major depressive disorder have either anhedonia or depressed mood on nearly a daily basis for at least 2 weeks or longer. Symptoms of depressed mood include:  Feelings of sadness (blue or down in the dumps) or emptiness.  Feelings of hopelessness or helplessness.  Tearfulness or episodes of crying (may be observed by others).  Irritability (children and adolescents). In addition to depressed mood or anhedonia or both, people with this disorder have at least four of the following symptoms:  Difficulty sleeping or sleeping too much.   Significant change (increase or decrease) in appetite or weight.   Lack of energy or  motivation.  Feelings of guilt and worthlessness.   Difficulty concentrating, remembering, or making decisions.  Unusually slow movement (psychomotor retardation) or restlessness (as observed by others).   Recurrent wishes for death, recurrent thoughts of self-harm (suicide), or a suicide attempt. People with major depressive disorder commonly have persistent negative thoughts about themselves, other people, and the world. People with severe major depressive disorder may experiencedistorted beliefs or perceptions about the world (psychotic delusions). They also may see or hear things that are not real (psychotic hallucinations). DIAGNOSIS Major depressive disorder is diagnosed through an assessment by your health care provider. Your health care provider will ask aboutaspects of your daily life, such as mood,sleep, and appetite, to see if you have the diagnostic symptoms of major depressive disorder. Your health care provider may ask about your medical history and use of alcohol or drugs, including prescription medicines. Your health care provider also may do a physical exam and blood work. This is because certain medical conditions and the use of certain substances can cause major depressive disorder-like symptoms (secondary depression). Your health care provider also may refer you to a mental health specialist for further evaluation and treatment. TREATMENT It is important to recognize the symptoms of major depressive disorder and seek treatment. The following treatments can be prescribed for this disorder:   Medicine. Antidepressant medicines usually are prescribed. Antidepressant medicines are thought to correct chemical imbalances in the brain that are commonly associated with major depressive disorder. Other types of medicine may be added if the symptoms do not respond to antidepressant medicines alone or if psychotic delusions or hallucinations occur.  Talk therapy. Talk therapy can be  helpful in treating major depressive disorder by providing   support, education, and guidance. Certain types of talk therapy also can help with negative thinking (cognitive behavioral therapy) and with relationship issues that trigger this disorder (interpersonal therapy). A mental health specialist can help determine which treatment is best for you. Most people with major depressive disorder do well with a combination of medicine and talk therapy. Treatments involving electrical stimulation of the brain can be used in situations with extremely severe symptoms or when medicine and talk therapy do not work over time. These treatments include electroconvulsive therapy, transcranial magnetic stimulation, and vagal nerve stimulation.   This information is not intended to replace advice given to you by your health care provider. Make sure you discuss any questions you have with your health care provider.   Document Released: 05/02/2012 Document Revised: 01/26/2014 Document Reviewed: 05/02/2012 Elsevier Interactive Patient Education 2016 Elsevier Inc.  

## 2015-05-06 NOTE — Progress Notes (Signed)
Pre visit review using our clinic review tool, if applicable. No additional management support is needed unless otherwise documented below in the visit note. 

## 2016-05-30 ENCOUNTER — Other Ambulatory Visit: Payer: Self-pay | Admitting: Family Medicine

## 2016-05-30 DIAGNOSIS — F329 Major depressive disorder, single episode, unspecified: Secondary | ICD-10-CM

## 2016-05-30 DIAGNOSIS — F32A Depression, unspecified: Secondary | ICD-10-CM

## 2016-07-09 ENCOUNTER — Other Ambulatory Visit: Payer: Self-pay | Admitting: Obstetrics and Gynecology

## 2016-07-09 DIAGNOSIS — D241 Benign neoplasm of right breast: Secondary | ICD-10-CM

## 2016-07-13 ENCOUNTER — Ambulatory Visit
Admission: RE | Admit: 2016-07-13 | Discharge: 2016-07-13 | Disposition: A | Discharge status: Home / Self Care | Payer: Commercial Managed Care - PPO | Source: Ambulatory Visit | Attending: Obstetrics and Gynecology | Admitting: Obstetrics and Gynecology

## 2016-07-13 DIAGNOSIS — D241 Benign neoplasm of right breast: Secondary | ICD-10-CM

## 2016-09-24 ENCOUNTER — Encounter: Payer: Self-pay | Admitting: Family Medicine

## 2016-12-22 ENCOUNTER — Encounter: Payer: Commercial Managed Care - PPO | Admitting: Family Medicine

## 2017-02-19 ENCOUNTER — Ambulatory Visit (INDEPENDENT_AMBULATORY_CARE_PROVIDER_SITE_OTHER): Payer: Commercial Managed Care - PPO | Admitting: Family Medicine

## 2017-02-19 ENCOUNTER — Encounter: Payer: Self-pay | Admitting: Family Medicine

## 2017-02-19 VITALS — BP 116/70 | HR 83 | Temp 98.2°F | Resp 16 | Ht 68.0 in | Wt 157.4 lb

## 2017-02-19 DIAGNOSIS — Z0000 Encounter for general adult medical examination without abnormal findings: Secondary | ICD-10-CM

## 2017-02-19 LAB — CBC WITH DIFFERENTIAL/PLATELET
Basophils Absolute: 0 10*3/uL (ref 0.0–0.1)
Basophils Relative: 0.3 % (ref 0.0–3.0)
EOS ABS: 0 10*3/uL (ref 0.0–0.7)
Eosinophils Relative: 0.7 % (ref 0.0–5.0)
HCT: 38.1 % (ref 36.0–46.0)
HEMOGLOBIN: 12.7 g/dL (ref 12.0–15.0)
LYMPHS PCT: 31.1 % (ref 12.0–46.0)
Lymphs Abs: 1.3 10*3/uL (ref 0.7–4.0)
MCHC: 33.3 g/dL (ref 30.0–36.0)
MCV: 90 fl (ref 78.0–100.0)
MONOS PCT: 9.4 % (ref 3.0–12.0)
Monocytes Absolute: 0.4 10*3/uL (ref 0.1–1.0)
NEUTROS ABS: 2.5 10*3/uL (ref 1.4–7.7)
NEUTROS PCT: 58.5 % (ref 43.0–77.0)
Platelets: 200 10*3/uL (ref 150.0–400.0)
RBC: 4.24 Mil/uL (ref 3.87–5.11)
RDW: 13.6 % (ref 11.5–15.5)
WBC: 4.2 10*3/uL (ref 4.0–10.5)

## 2017-02-19 LAB — COMPREHENSIVE METABOLIC PANEL
ALBUMIN: 4 g/dL (ref 3.5–5.2)
ALK PHOS: 42 U/L (ref 39–117)
ALT: 11 U/L (ref 0–35)
AST: 10 U/L (ref 0–37)
BILIRUBIN TOTAL: 0.7 mg/dL (ref 0.2–1.2)
BUN: 10 mg/dL (ref 6–23)
CO2: 26 mEq/L (ref 19–32)
CREATININE: 0.87 mg/dL (ref 0.40–1.20)
Calcium: 8.5 mg/dL (ref 8.4–10.5)
Chloride: 102 mEq/L (ref 96–112)
GFR: 74.92 mL/min (ref >60.00)
GLUCOSE: 93 mg/dL (ref 70–99)
Potassium: 3.4 mEq/L — ABNORMAL LOW (ref 3.5–5.1)
SODIUM: 136 meq/L (ref 135–145)
TOTAL PROTEIN: 7 g/dL (ref 6.0–8.3)

## 2017-02-19 LAB — LIPID PANEL
CHOLESTEROL: 191 mg/dL (ref 0–200)
HDL: 53.1 mg/dL (ref >39.00)
LDL Cholesterol: 122 mg/dL — ABNORMAL HIGH (ref 0–99)
NONHDL: 138.3
Total CHOL/HDL Ratio: 4
Triglycerides: 80 mg/dL (ref 0.0–149.0)
VLDL: 16 mg/dL (ref 0.0–40.0)

## 2017-02-19 LAB — TSH: TSH: 1.3 u[IU]/mL (ref 0.35–4.50)

## 2017-02-19 NOTE — Patient Instructions (Signed)
Preventive Care 40-64 Years, Female Preventive care refers to lifestyle choices and visits with your health care provider that can promote health and wellness. What does preventive care include?  A yearly physical exam. This is also called an annual well check.  Dental exams once or twice a year.  Routine eye exams. Ask your health care provider how often you should have your eyes checked.  Personal lifestyle choices, including: ? Daily care of your teeth and gums. ? Regular physical activity. ? Eating a healthy diet. ? Avoiding tobacco and drug use. ? Limiting alcohol use. ? Practicing safe sex. ? Taking low-dose aspirin daily starting at age 58. ? Taking vitamin and mineral supplements as recommended by your health care provider. What happens during an annual well check? The services and screenings done by your health care provider during your annual well check will depend on your age, overall health, lifestyle risk factors, and family history of disease. Counseling Your health care provider may ask you questions about your:  Alcohol use.  Tobacco use.  Drug use.  Emotional well-being.  Home and relationship well-being.  Sexual activity.  Eating habits.  Work and work Statistician.  Method of birth control.  Menstrual cycle.  Pregnancy history.  Screening You may have the following tests or measurements:  Height, weight, and BMI.  Blood pressure.  Lipid and cholesterol levels. These may be checked every 5 years, or more frequently if you are over 81 years old.  Skin check.  Lung cancer screening. You may have this screening every year starting at age 78 if you have a 30-pack-year history of smoking and currently smoke or have quit within the past 15 years.  Fecal occult blood test (FOBT) of the stool. You may have this test every year starting at age 65.  Flexible sigmoidoscopy or colonoscopy. You may have a sigmoidoscopy every 5 years or a colonoscopy  every 10 years starting at age 30.  Hepatitis C blood test.  Hepatitis B blood test.  Sexually transmitted disease (STD) testing.  Diabetes screening. This is done by checking your blood sugar (glucose) after you have not eaten for a while (fasting). You may have this done every 1-3 years.  Mammogram. This may be done every 1-2 years. Talk to your health care provider about when you should start having regular mammograms. This may depend on whether you have a family history of breast cancer.  BRCA-related cancer screening. This may be done if you have a family history of breast, ovarian, tubal, or peritoneal cancers.  Pelvic exam and Pap test. This may be done every 3 years starting at age 80. Starting at age 36, this may be done every 5 years if you have a Pap test in combination with an HPV test.  Bone density scan. This is done to screen for osteoporosis. You may have this scan if you are at high risk for osteoporosis.  Discuss your test results, treatment options, and if necessary, the need for more tests with your health care provider. Vaccines Your health care provider may recommend certain vaccines, such as:  Influenza vaccine. This is recommended every year.  Tetanus, diphtheria, and acellular pertussis (Tdap, Td) vaccine. You may need a Td booster every 10 years.  Varicella vaccine. You may need this if you have not been vaccinated.  Zoster vaccine. You may need this after age 5.  Measles, mumps, and rubella (MMR) vaccine. You may need at least one dose of MMR if you were born in  1955 or later. You may also need a second dose.  Pneumococcal 13-valent conjugate (PCV13) vaccine. You may need this if you have certain conditions and were not previously vaccinated.  Pneumococcal polysaccharide (PPSV23) vaccine. You may need one or two doses if you smoke cigarettes or if you have certain conditions.  Meningococcal vaccine. You may need this if you have certain  conditions.  Hepatitis A vaccine. You may need this if you have certain conditions or if you travel or work in places where you may be exposed to hepatitis A.  Hepatitis B vaccine. You may need this if you have certain conditions or if you travel or work in places where you may be exposed to hepatitis B.  Haemophilus influenzae type b (Hib) vaccine. You may need this if you have certain conditions.  Talk to your health care provider about which screenings and vaccines you need and how often you need them. This information is not intended to replace advice given to you by your health care provider. Make sure you discuss any questions you have with your health care provider. Document Released: 02/01/2015 Document Revised: 09/25/2015 Document Reviewed: 11/06/2014 Elsevier Interactive Patient Education  2018 Elsevier Inc.  

## 2017-02-19 NOTE — Progress Notes (Signed)
Subjective:   I acted as a Education administrator for Dr. Carollee Herter.  Angela Harrington, CMA   Angela Harrington is a 47 y.o. female and is here for a comprehensive physical exam. The patient reports no problems.  Social History   Socioeconomic History  . Marital status: Married    Spouse name: Not on file  . Number of children: 0  . Years of education: Not on file  . Highest education level: Not on file  Social Needs  . Financial resource strain: Not on file  . Food insecurity - worry: Not on file  . Food insecurity - inability: Not on file  . Transportation needs - medical: Not on file  . Transportation needs - non-medical: Not on file  Occupational History  . Occupation: Haematologist: [employer]  Tobacco Use  . Smoking status: Never Smoker  . Smokeless tobacco: Never Used  Substance and Sexual Activity  . Alcohol use: Yes    Comment: Socially  . Drug use: No  . Sexual activity: Not on file  Other Topics Concern  . Not on file  Social History Narrative   Regular exercise- no   Health Maintenance  Topic Date Due  . PAP SMEAR  12/02/2015  . HIV Screening  02/20/2023 (Originally 05/18/1987)  . INFLUENZA VACCINE  10/23/2023 (Originally 08/19/2016)  . MAMMOGRAM  07/13/2017  . TETANUS/TDAP  09/29/2024    The following portions of the patient's history were reviewed and updated as appropriate:  She  has a past medical history of Allergy, Anemia (as a child), and Endometriosis (1995). She does not have any pertinent problems on file. She  has a past surgical history that includes laporoscopy for endometriosis (1995) and Tonsillectomy (1979). Her family history includes Alcohol abuse in her father; Arthritis in her father and mother; Breast cancer in her sister; Cancer in her other; Hyperlipidemia in her mother; Hypertension in her father. She  reports that  has never smoked. she has never used smokeless tobacco. She reports that she drinks alcohol. She reports that she does not use  drugs. She has a current medication list which includes the following prescription(s): bupropion, citalopram, and norethindrone-ethinyl estradiol. Current Outpatient Medications on File Prior to Visit  Medication Sig Dispense Refill  . buPROPion (WELLBUTRIN XL) 300 MG 24 hr tablet Take 1 tablet (300 mg total) by mouth daily. 90 tablet 3  . citalopram (CELEXA) 10 MG tablet Take 1 tablet (10 mg total) by mouth daily. 90 tablet 3  . norethindrone-ethinyl estradiol (JUNEL FE,GILDESS FE,LOESTRIN FE) 1-20 MG-MCG tablet Take 1 tablet by mouth daily.       No current facility-administered medications on file prior to visit.    She has No Known Allergies..  Review of Systems Review of Systems  Constitutional: Negative for activity change, appetite change and fatigue.  HENT: Negative for hearing loss, congestion, tinnitus and ear discharge.  dentist q65m Eyes: Negative for visual disturbance (see optho q1y -- vision corrected to 20/20 with glasses).  Respiratory: Negative for cough, chest tightness and shortness of breath.   Cardiovascular: Negative for chest pain, palpitations and leg swelling.  Gastrointestinal: Negative for abdominal pain, diarrhea, constipation and abdominal distention.  Genitourinary: Negative for urgency, frequency, decreased urine volume and difficulty urinating.  Musculoskeletal: Negative for back pain, arthralgias and gait problem.  Skin: Negative for color change, pallor and rash.  Neurological: Negative for dizziness, light-headedness, numbness and headaches.  Hematological: Negative for adenopathy. Does not bruise/bleed easily.  Psychiatric/Behavioral:  Negative for suicidal ideas, confusion, sleep disturbance, self-injury, dysphoric mood, decreased concentration and agitation.       Objective:    BP 116/70 (BP Location: Right Arm, Cuff Size: Normal)   Pulse 83   Temp 98.2 F (36.8 C) (Oral)   Resp 16   Ht 5\' 8"  (1.727 m)   Wt 157 lb 6.4 oz (71.4 kg)   LMP  01/29/2017   SpO2 98%   BMI 23.93 kg/m  General appearance: alert, cooperative, appears stated age and no distress Head: Normocephalic, without obvious abnormality, atraumatic Eyes: conjunctivae/corneas clear. PERRL, EOM's intact. Fundi benign. Ears: normal TM's and external ear canals both ears Nose: Nares normal. Septum midline. Mucosa normal. No drainage or sinus tenderness. Throat: lips, mucosa, and tongue normal; teeth and gums normal Neck: no adenopathy, no carotid bruit, no JVD, supple, symmetrical, trachea midline and thyroid not enlarged, symmetric, no tenderness/mass/nodules Back: symmetric, no curvature. ROM normal. No CVA tenderness. Lungs: clear to auscultation bilaterally Breasts: gyn Heart: regular rate and rhythm, S1, S2 normal, no murmur, click, rub or gallop Abdomen: soft, non-tender; bowel sounds normal; no masses,  no organomegaly Pelvic: deferred--gyn Extremities: extremities normal, atraumatic, no cyanosis or edema Pulses: 2+ and symmetric Skin: Skin color, texture, turgor normal. No rashes or lesions Lymph nodes: Cervical, supraclavicular, and axillary nodes normal. Neurologic: Alert and oriented X 3, normal strength and tone. Normal symmetric reflexes. Normal coordination and gait    Assessment:    Healthy female exam.      Plan:    ghm utd Check labs  See After Visit Summary for Counseling Recommendations

## 2017-04-08 ENCOUNTER — Ambulatory Visit: Payer: Commercial Managed Care - PPO | Admitting: Family Medicine

## 2017-04-08 ENCOUNTER — Encounter: Payer: Self-pay | Admitting: Family Medicine

## 2017-04-08 VITALS — BP 102/72 | HR 93 | Temp 98.0°F | Ht 66.0 in | Wt 155.1 lb

## 2017-04-08 DIAGNOSIS — B9789 Other viral agents as the cause of diseases classified elsewhere: Secondary | ICD-10-CM

## 2017-04-08 DIAGNOSIS — J069 Acute upper respiratory infection, unspecified: Secondary | ICD-10-CM | POA: Diagnosis not present

## 2017-04-08 MED ORDER — BENZONATATE 100 MG PO CAPS: 100.0000 mg | ORAL_CAPSULE | Freq: Three times a day (TID) | ORAL | 0 refills | Status: DC | PRN

## 2017-04-08 MED FILL — BENZONATATE 100 MG CAPSULE: 100 | 10 days supply | Qty: 30 | Fill #0

## 2017-04-08 NOTE — Progress Notes (Signed)
Chief Complaint  Patient presents with  . Cough    Vonita Moss here for URI complaints.  Duration: 1 day  Associated symptoms: cough, throat irritation Denies: sinus congestion, sinus pain, rhinorrhea, itchy watery eyes, ear pain, ear drainage, wheezing, shortness of breath, myalgia and fevers Treatment to date: none Sick contacts: No  ROS:  Const: Denies fevers HEENT: As noted in HPI Lungs: No SOB  Past Medical History:  Diagnosis Date  . Allergy    rhinitis  . Anemia as a child  . Endometriosis 1995   Family History  Problem Relation Age of Onset  . Cancer Other        breast  . Arthritis Mother        osteoarthritis  . Hyperlipidemia Mother   . Alcohol abuse Father   . Arthritis Father        rheumatoid  . Hypertension Father   . Breast cancer Sister     BP 102/72 (BP Location: Left Arm, Patient Position: Sitting, Cuff Size: Normal)   Pulse 93   Temp 98 F (36.7 C) (Oral)   Ht 5\' 6"  (1.676 m)   Wt 155 lb 2 oz (70.4 kg)   SpO2 97%   BMI 25.04 kg/m  General: Awake, alert, appears stated age HEENT: AT, [place], ears patent b/l and TM's neg, nares patent w/o discharge, pharynx pink and without exudates, MMM Neck: No masses or asymmetry Heart: RRR Lungs: CTAB, no accessory muscle use Psych: Age appropriate judgment and insight, normal mood and affect  Viral URI with cough - Plan: benzonatate (TESSALON) 100 MG capsule  Orders as above. Likely viral. Continue to push fluids, practice good hand hygiene, cover mouth when coughing. F/u prn. If starting to experience fevers, shaking, or shortness of breath, seek immediate care. Pt voiced understanding and agreement to the plan.  [place], DO 04/08/17 3:34 PM

## 2017-04-08 NOTE — Patient Instructions (Signed)
Continue to push fluids, practice good hand hygiene, and cover your mouth if you cough.  If you start having fevers, shaking or shortness of breath, seek immediate care.  Let us know if you need anything.  

## 2017-04-08 NOTE — Progress Notes (Signed)
Pre visit review using our clinic review tool, if applicable. No additional management support is needed unless otherwise documented below in the visit note. 

## 2017-04-29 ENCOUNTER — Ambulatory Visit: Payer: Commercial Managed Care - PPO | Admitting: Family Medicine

## 2017-04-29 ENCOUNTER — Encounter: Payer: Self-pay | Admitting: Family Medicine

## 2017-04-29 VITALS — BP 116/68 | HR 74 | Temp 98.3°F | Resp 16 | Ht 66.0 in | Wt 158.4 lb

## 2017-04-29 DIAGNOSIS — J069 Acute upper respiratory infection, unspecified: Secondary | ICD-10-CM | POA: Diagnosis not present

## 2017-04-29 DIAGNOSIS — R21 Rash and other nonspecific skin eruption: Secondary | ICD-10-CM | POA: Diagnosis not present

## 2017-04-29 MED ORDER — TRIAMCINOLONE ACETONIDE 0.1 % EX CREA: 1.0000 "application " | TOPICAL_CREAM | Freq: Two times a day (BID) | CUTANEOUS | 0 refills | Status: DC

## 2017-04-29 NOTE — Patient Instructions (Signed)
Rash A rash is a change in the color of the skin. A rash can also change the way your skin feels. There are many different conditions and factors that can cause a rash. Follow these instructions at home: Pay attention to any changes in your symptoms. Follow these instructions to help with your condition: Medicine Take or apply over-the-counter and prescription medicines only as told by your health care provider. These may include:  Corticosteroid cream.  Anti-itch lotions.  Oral antihistamines.  Skin Care  Apply cool compresses to the affected areas.  Try taking a bath with: ? Epsom salts. Follow the instructions on the packaging. You can get these at your local pharmacy or grocery store. ? Baking soda. Pour a small amount into the bath as told by your health care provider. ? Colloidal oatmeal. Follow the instructions on the packaging. You can get this at your local pharmacy or grocery store.  Try applying baking soda paste to your skin. Stir water into baking soda until it reaches a paste-like consistency.  Do not scratch or rub your skin.  Avoid covering the rash. Make sure the rash is exposed to air as much as possible. General instructions  Avoid hot showers or baths, which can make itching worse. A cold shower may help.  Avoid scented soaps, detergents, and perfumes. Use gentle soaps, detergents, perfumes, and other cosmetic products.  Avoid any substance that causes your rash. Keep a journal to help track what causes your rash. Write down: ? What you eat. ? What cosmetic products you use. ? What you drink. ? What you wear. This includes jewelry.  Keep all follow-up visits as told by your health care provider. This is important. Contact a health care provider if:  You sweat at night.  You lose weight.  You urinate more than normal.  You feel weak.  You vomit.  Your skin or the whites of your eyes look yellow (jaundice).  Your skin: ? Tingles. ? Is  numb.  Your rash: ? Does not go away after several days. ? Gets worse.  You are: ? Unusually thirsty. ? More tired than normal.  You have: ? New symptoms. ? Pain in your abdomen. ? A fever. ? Diarrhea. Get help right away if:  You develop a rash that covers all or most of your body. The rash may or may not be painful.  You develop blisters that: ? Are on top of the rash. ? Grow larger or grow together. ? Are painful. ? Are inside your nose or mouth.  You develop a rash that: ? Looks like purple pinprick-sized spots all over your body. ? Has a "bull's eye" or looks like a target. ? Is not related to sun exposure, is red and painful, and causes your skin to peel. This information is not intended to replace advice given to you by your health care provider. Make sure you discuss any questions you have with your health care provider. Document Released: 12/26/2001 Document Revised: 06/11/2015 Document Reviewed: 05/23/2014 Elsevier Interactive Patient Education  2018 Elsevier Inc.  

## 2017-04-29 NOTE — Progress Notes (Signed)
Patient ID: Angela Harrington, female   DOB: Aug 17, 1970, 47 y.o.   MRN: [id number]     Subjective:  I acted as a Education administrator for Dr. Carollee Herter.  Angela Harrington, [place]   Patient ID: Angela Harrington, female    DOB: 11-Apr-1970, 47 y.o.   MRN: [id number]  Chief Complaint  Patient presents with  . Rash    chin    HPI  Patient is in today for rash on chin and edge of nose.  Noticed several weeks ago.  Somewhat itchy-- burns.  Has not tried anything otc   Patient Care Team: Carollee Herter, Alferd Apa, DO as PCP - General   Past Medical History:  Diagnosis Date  . Allergy    rhinitis  . Anemia as a child  . Endometriosis 1995    Past Surgical History:  Procedure Laterality Date  . laporoscopy for endometriosis  1995  . TONSILLECTOMY  1979    Family History  Problem Relation Age of Onset  . Cancer Other        breast  . Arthritis Mother        osteoarthritis  . Hyperlipidemia Mother   . Alcohol abuse Father   . Arthritis Father        rheumatoid  . Hypertension Father   . Breast cancer Sister     Social History   Socioeconomic History  . Marital status: Married    Spouse name: Not on file  . Number of children: 0  . Years of education: Not on file  . Highest education level: Not on file  Occupational History  . Occupation: Haematologist: [employer]  . Financial resource strain: Not on file  . Food insecurity:    Worry: Not on file    Inability: Not on file  . Transportation needs:    Medical: Not on file    Non-medical: Not on file  Tobacco Use  . Smoking status: Never Smoker  . Smokeless tobacco: Never Used  Substance and Sexual Activity  . Alcohol use: Yes    Comment: Socially  . Drug use: No  . Sexual activity: Not on file  Lifestyle  . Physical activity:    Days per week: Not on file    Minutes per session: Not on file  . Stress: Not on file  Relationships  . Social connections:    Talks on phone: Not on file    Gets together: Not on file     Attends religious service: Not on file    Active member of club or organization: Not on file    Attends meetings of clubs or organizations: Not on file    Relationship status: Not on file  . Intimate partner violence:    Fear of current or ex partner: Not on file    Emotionally abused: Not on file    Physically abused: Not on file    Forced sexual activity: Not on file  Other Topics Concern  . Not on file  Social History Narrative   Regular exercise- no    Outpatient Medications Prior to Visit  Medication Sig Dispense Refill  . buPROPion (WELLBUTRIN XL) 300 MG 24 hr tablet Take 1 tablet (300 mg total) by mouth daily. 90 tablet 3  . citalopram (CELEXA) 10 MG tablet Take 1 tablet (10 mg total) by mouth daily. 90 tablet 3  . norethindrone-ethinyl estradiol (JUNEL FE,GILDESS FE,LOESTRIN FE) 1-20 MG-MCG tablet Take 1 tablet by  mouth daily.      . benzonatate (TESSALON) 100 MG capsule Take 1 capsule (100 mg total) by mouth 3 (three) times daily as needed. 30 capsule 0   No facility-administered medications prior to visit.     No Known Allergies  Review of Systems  Constitutional: Negative for fever and malaise/fatigue.  HENT: Negative for congestion.   Eyes: Negative for blurred vision.  Respiratory: Negative for cough and shortness of breath.   Cardiovascular: Negative for chest pain, palpitations and leg swelling.  Gastrointestinal: Negative for vomiting.  Musculoskeletal: Negative for back pain.  Skin: Positive for rash (chin and edge of nose).  Neurological: Negative for loss of consciousness and headaches.       Objective:    Physical Exam  HENT:  Head:    Skin: Rash noted. Rash is papular.    BP 116/68 (BP Location: Right Arm, Cuff Size: Normal)   Pulse 74   Temp 98.3 F (36.8 C) (Oral)   Resp 16   Ht 5\' 6"  (1.676 m)   Wt 158 lb 6.4 oz (71.8 kg)   LMP 04/29/2017   SpO2 96%   BMI 25.57 kg/m  Wt Readings from Last 3 Encounters:  04/29/17 158 lb 6.4 oz  (71.8 kg)  04/08/17 155 lb 2 oz (70.4 kg)  02/19/17 157 lb 6.4 oz (71.4 kg)   BP Readings from Last 3 Encounters:  04/29/17 116/68  04/08/17 102/72  02/19/17 116/70     Immunization History  Administered Date(s) Administered  . Tdap 01/19/2005, 09/30/2014    Health Maintenance  Topic Date Due  . PAP SMEAR  12/02/2015  . HIV Screening  02/20/2023 (Originally 05/18/1987)  . INFLUENZA VACCINE  10/23/2023 (Originally 08/19/2017)  . MAMMOGRAM  07/13/2017  . TETANUS/TDAP  09/29/2024    Lab Results  Component Value Date   WBC 4.2 02/19/2017   HGB 12.7 02/19/2017   HCT 38.1 02/19/2017   PLT 200.0 02/19/2017   GLUCOSE 93 02/19/2017   CHOL 191 02/19/2017   TRIG 80.0 02/19/2017   HDL 53.10 02/19/2017   LDLDIRECT 141.7 11/18/2011   LDLCALC 122 (H) 02/19/2017   ALT 11 02/19/2017   AST 10 02/19/2017   NA 136 02/19/2017   K 3.4 (L) 02/19/2017   CL 102 02/19/2017   CREATININE 0.87 02/19/2017   BUN 10 02/19/2017   CO2 26 02/19/2017   TSH 1.30 02/19/2017    Lab Results  Component Value Date   TSH 1.30 02/19/2017   Lab Results  Component Value Date   WBC 4.2 02/19/2017   HGB 12.7 02/19/2017   HCT 38.1 02/19/2017   MCV 90.0 02/19/2017   PLT 200.0 02/19/2017   Lab Results  Component Value Date   NA 136 02/19/2017   K 3.4 (L) 02/19/2017   CO2 26 02/19/2017   GLUCOSE 93 02/19/2017   BUN 10 02/19/2017   CREATININE 0.87 02/19/2017   BILITOT 0.7 02/19/2017   ALKPHOS 42 02/19/2017   AST 10 02/19/2017   ALT 11 02/19/2017   PROT 7.0 02/19/2017   ALBUMIN 4.0 02/19/2017   CALCIUM 8.5 02/19/2017   ANIONGAP 9 09/30/2014   GFR 74.92 02/19/2017   Lab Results  Component Value Date   CHOL 191 02/19/2017   Lab Results  Component Value Date   HDL 53.10 02/19/2017   Lab Results  Component Value Date   LDLCALC 122 (H) 02/19/2017   Lab Results  Component Value Date   TRIG 80.0 02/19/2017   Lab Results  Component Value Date   CHOLHDL 4 02/19/2017   No results found  for: HGBA1C       Assessment & Plan:   Problem List Items Addressed This Visit    None    Visit Diagnoses    Rash    -  Primary   Relevant Medications   triamcinolone cream (KENALOG) 0.1 %    rto prn  I have discontinued Tarsha M. Barkow's benzonatate. I am also having her start on triamcinolone cream. Additionally, I am having her maintain her norethindrone-ethinyl estradiol, buPROPion, and citalopram.  Meds ordered this encounter  Medications  . triamcinolone cream (KENALOG) 0.1 %    Sig: Apply 1 application topically 2 (two) times daily.    Dispense:  30 g    Refill:  0    CMA served as scribe during this visit. History, Physical and Plan performed by medical provider. Documentation and orders reviewed and attested to.  Ann Held, DO

## 2017-06-09 ENCOUNTER — Telehealth: Payer: Commercial Managed Care - PPO | Admitting: Family

## 2017-06-09 DIAGNOSIS — J209 Acute bronchitis, unspecified: Secondary | ICD-10-CM

## 2017-06-09 MED ORDER — PREDNISONE 10 MG (21) PO TBPK: ORAL_TABLET | ORAL | 0 refills | Status: DC

## 2017-06-09 NOTE — Progress Notes (Signed)

## 2017-08-09 ENCOUNTER — Other Ambulatory Visit: Payer: Self-pay | Admitting: Family Medicine

## 2017-08-09 DIAGNOSIS — Z1231 Encounter for screening mammogram for malignant neoplasm of breast: Secondary | ICD-10-CM

## 2017-08-23 ENCOUNTER — Encounter: Payer: Self-pay | Admitting: Family Medicine

## 2017-08-24 ENCOUNTER — Other Ambulatory Visit: Payer: Self-pay | Admitting: Family Medicine

## 2017-08-24 DIAGNOSIS — F329 Major depressive disorder, single episode, unspecified: Secondary | ICD-10-CM

## 2017-08-24 DIAGNOSIS — F32A Depression, unspecified: Secondary | ICD-10-CM

## 2017-08-24 MED ORDER — CITALOPRAM HYDROBROMIDE 10 MG PO TABS: 10.0000 mg | ORAL_TABLET | Freq: Every day | ORAL | 3 refills | Status: DC

## 2017-08-24 MED ORDER — BUPROPION HCL ER (XL) 150 MG PO TB24: 150.0000 mg | ORAL_TABLET | Freq: Every day | ORAL | 2 refills | Status: DC

## 2017-08-24 NOTE — Telephone Encounter (Signed)
Refill x 1 month-- ov

## 2017-08-25 NOTE — Telephone Encounter (Signed)
Do you want to refill both celexa and wellbutrin right?

## 2017-08-25 NOTE — Telephone Encounter (Signed)
Sent them both in -- should f/u 1 month

## 2017-08-27 ENCOUNTER — Ambulatory Visit
Admission: RE | Admit: 2017-08-27 | Discharge: 2017-08-27 | Disposition: A | Discharge status: Home / Self Care | Payer: Commercial Managed Care - PPO | Source: Ambulatory Visit | Attending: Family Medicine | Admitting: Family Medicine

## 2017-08-27 DIAGNOSIS — Z1231 Encounter for screening mammogram for malignant neoplasm of breast: Secondary | ICD-10-CM

## 2017-08-30 ENCOUNTER — Other Ambulatory Visit: Payer: Self-pay | Admitting: Family Medicine

## 2017-08-30 DIAGNOSIS — R928 Other abnormal and inconclusive findings on diagnostic imaging of breast: Secondary | ICD-10-CM

## 2017-08-31 ENCOUNTER — Telehealth: Payer: Self-pay | Admitting: *Deleted

## 2017-08-31 NOTE — Telephone Encounter (Signed)
Received Physician Orders from [place]; forwarded to provider/SLS 08/13

## 2017-09-02 ENCOUNTER — Ambulatory Visit
Admission: RE | Admit: 2017-09-02 | Discharge: 2017-09-02 | Disposition: A | Discharge status: Home / Self Care | Payer: Commercial Managed Care - PPO | Source: Ambulatory Visit | Attending: Family Medicine | Admitting: Family Medicine

## 2017-09-02 DIAGNOSIS — R928 Other abnormal and inconclusive findings on diagnostic imaging of breast: Secondary | ICD-10-CM

## 2017-09-21 ENCOUNTER — Encounter: Payer: Self-pay | Admitting: Family Medicine

## 2017-09-21 ENCOUNTER — Ambulatory Visit: Payer: Commercial Managed Care - PPO | Admitting: Family Medicine

## 2017-09-21 VITALS — BP 109/55 | HR 74 | Temp 98.2°F | Resp 16 | Ht 66.0 in | Wt 152.0 lb

## 2017-09-21 DIAGNOSIS — F418 Other specified anxiety disorders: Secondary | ICD-10-CM | POA: Diagnosis not present

## 2017-09-21 DIAGNOSIS — F329 Major depressive disorder, single episode, unspecified: Secondary | ICD-10-CM | POA: Diagnosis not present

## 2017-09-21 DIAGNOSIS — F32A Depression, unspecified: Secondary | ICD-10-CM

## 2017-09-21 MED ORDER — BUPROPION HCL ER (XL) 300 MG PO TB24: 300.0000 mg | ORAL_TABLET | Freq: Every day | ORAL | 3 refills | Status: DC

## 2017-09-21 MED ORDER — CITALOPRAM HYDROBROMIDE 10 MG PO TABS: 10.0000 mg | ORAL_TABLET | Freq: Every day | ORAL | 3 refills | Status: DC

## 2017-09-21 NOTE — Progress Notes (Signed)
Patient ID: Angela Harrington, female    DOB: Mar 06, 1970  Age: 47 y.o. MRN: [id number]    Subjective:  Subjective  HPI MADDISEN VOUGHT presents for f/u anxiety / depression.  She is doing well   She would like to inc the wellbutrin to 300 mg  No complaints.    Review of Systems  Constitutional: Negative for chills and fever.  HENT: Negative for congestion and hearing loss.   Eyes: Negative for discharge.  Respiratory: Negative for cough and shortness of breath.   Cardiovascular: Negative for chest pain, palpitations and leg swelling.  Gastrointestinal: Negative for abdominal pain, blood in stool, constipation, diarrhea, nausea and vomiting.  Genitourinary: Negative for dysuria, frequency, hematuria and urgency.  Musculoskeletal: Negative for back pain and myalgias.  Skin: Negative for rash.  Allergic/Immunologic: Negative for environmental allergies.  Neurological: Negative for dizziness, weakness and headaches.  Hematological: Does not bruise/bleed easily.  Psychiatric/Behavioral: Negative for suicidal ideas. The patient is not nervous/anxious.     History Past Medical History:  Diagnosis Date  . Allergy    rhinitis  . Anemia as a child  . Endometriosis 1995    She has a past surgical history that includes laporoscopy for endometriosis (1995) and Tonsillectomy (1979).   Her family history includes Alcohol abuse in her father; Arthritis in her father and mother; Breast cancer in her sister; Cancer in her other; Hyperlipidemia in her mother; Hypertension in her father.She reports that she has never smoked. She has never used smokeless tobacco. She reports that she drinks alcohol. She reports that she does not use drugs.  Current Outpatient Medications on File Prior to Visit  Medication Sig Dispense Refill  . Levonorgestrel-Ethinyl Estrad (LARISSIA PO) Take 1 tablet by mouth daily.     No current facility-administered medications on file prior to visit.      Objective:  Objective   Physical Exam  Constitutional: She is oriented to person, place, and time. She appears well-developed and well-nourished.  HENT:  Head: Normocephalic and atraumatic.  Eyes: Conjunctivae and EOM are normal.  Neck: Normal range of motion. Neck supple. No JVD present. Carotid bruit is not present. No thyromegaly present.  Cardiovascular: Normal rate, regular rhythm and normal heart sounds.  No murmur heard. Pulmonary/Chest: Effort normal and breath sounds normal. No respiratory distress. She has no wheezes. She has no rales. She exhibits no tenderness.  Musculoskeletal: She exhibits no edema.  Neurological: She is alert and oriented to person, place, and time.  Psychiatric: She has a normal mood and affect. Her behavior is normal. Judgment and thought content normal.  Nursing note and vitals reviewed.  BP (!) 109/55 (BP Location: Left Arm, Cuff Size: Normal)   Pulse 74   Temp 98.2 F (36.8 C) (Oral)   Resp 16   Ht 5\' 6"  (1.676 m)   Wt 152 lb (68.9 kg)   LMP 08/17/2017   SpO2 100%   BMI 24.53 kg/m  Wt Readings from Last 3 Encounters:  09/21/17 152 lb (68.9 kg)  04/29/17 158 lb 6.4 oz (71.8 kg)  04/08/17 155 lb 2 oz (70.4 kg)     Lab Results  Component Value Date   WBC 4.2 02/19/2017   HGB 12.7 02/19/2017   HCT 38.1 02/19/2017   PLT 200.0 02/19/2017   GLUCOSE 93 02/19/2017   CHOL 191 02/19/2017   TRIG 80.0 02/19/2017   HDL 53.10 02/19/2017   LDLDIRECT 141.7 11/18/2011   LDLCALC 122 (H) 02/19/2017   ALT 11 02/19/2017  AST 10 02/19/2017   NA 136 02/19/2017   K 3.4 (L) 02/19/2017   CL 102 02/19/2017   CREATININE 0.87 02/19/2017   BUN 10 02/19/2017   CO2 26 02/19/2017   TSH 1.30 02/19/2017    US Breast Ltd Uni Left Inc Axilla  Result Date: 09/02/2017 CLINICAL DATA:  Screening recall for possible left breast mass. EXAM: DIGITAL DIAGNOSTIC LEFT MAMMOGRAM WITH CAD AND TOMO ULTRASOUND LEFT BREAST COMPARISON:  Previous exam(s). ACR Breast Density Category c: The breast  tissue is heterogeneously dense, which may obscure small masses. FINDINGS: On the spot-compression imaging, a partly obscured mass persists. Where visible, the margins are circumscribed. Mass is oval in shape lying in the posterior retroareolar region. Mammographic images were processed with CAD. On physical exam, no mass is palpated in the retroareolar left breast. Targeted ultrasound is performed, showing a simple cyst at 1 o'clock, 1 cm from the, posterior depth, measuring 16 x 11 x 17 mm, consistent in size, shape and location to the mammographic finding. There are no solid masses or suspicious lesions. IMPRESSION: 1. No evidence of breast malignancy. 2. Benign left breast cyst. RECOMMENDATION: Screening mammogram in one year.(Code:SM-B-01Y) I have discussed the findings and recommendations with the patient. Results were also provided in writing at the conclusion of the visit. If applicable, a reminder letter will be sent to the patient regarding the next appointment. BI-RADS CATEGORY  2: Benign. Electronically Signed   By: Lajean Manes M.D.   On: 09/02/2017 13:04   Mm Diag Breast Tomo Uni Left  Result Date: 09/02/2017 CLINICAL DATA:  Screening recall for possible left breast mass. EXAM: DIGITAL DIAGNOSTIC LEFT MAMMOGRAM WITH CAD AND TOMO ULTRASOUND LEFT BREAST COMPARISON:  Previous exam(s). ACR Breast Density Category c: The breast tissue is heterogeneously dense, which may obscure small masses. FINDINGS: On the spot-compression imaging, a partly obscured mass persists. Where visible, the margins are circumscribed. Mass is oval in shape lying in the posterior retroareolar region. Mammographic images were processed with CAD. On physical exam, no mass is palpated in the retroareolar left breast. Targeted ultrasound is performed, showing a simple cyst at 1 o'clock, 1 cm from the, posterior depth, measuring 16 x 11 x 17 mm, consistent in size, shape and location to the mammographic finding. There are no solid  masses or suspicious lesions. IMPRESSION: 1. No evidence of breast malignancy. 2. Benign left breast cyst. RECOMMENDATION: Screening mammogram in one year.(Code:SM-B-01Y) I have discussed the findings and recommendations with the patient. Results were also provided in writing at the conclusion of the visit. If applicable, a reminder letter will be sent to the patient regarding the next appointment. BI-RADS CATEGORY  2: Benign. Electronically Signed   By: Lajean Manes M.D.   On: 09/02/2017 13:04     Assessment & Plan:  Plan  I have discontinued Taylee M. Zuch's norethindrone-ethinyl estradiol, triamcinolone cream, predniSONE, and buPROPion. I am also having her start on buPROPion. Additionally, I am having her maintain her Levonorgestrel-Ethinyl Estrad (LARISSIA PO) and citalopram.  Meds ordered this encounter  Medications  . buPROPion (WELLBUTRIN XL) 300 MG 24 hr tablet    Sig: Take 1 tablet (300 mg total) by mouth daily.    Dispense:  90 tablet    Refill:  3  . citalopram (CELEXA) 10 MG tablet    Sig: Take 1 tablet (10 mg total) by mouth daily.    Dispense:  90 tablet    Refill:  3    Problem List Items Addressed  This Visit    None    Visit Diagnoses    Depression with anxiety    -  Primary   Relevant Medications   buPROPion (WELLBUTRIN XL) 300 MG 24 hr tablet   citalopram (CELEXA) 10 MG tablet   Depression       Relevant Medications   buPROPion (WELLBUTRIN XL) 300 MG 24 hr tablet   citalopram (CELEXA) 10 MG tablet      con't celexa and wellbutrin xl    Follow-up: Return if symptoms worsen or fail to improve, for annual exam, fasting.  Ann Held, DO

## 2017-09-21 NOTE — Patient Instructions (Signed)

## 2018-03-04 ENCOUNTER — Encounter: Payer: Commercial Managed Care - PPO | Admitting: Family Medicine

## 2018-03-04 DIAGNOSIS — Z0289 Encounter for other administrative examinations: Secondary | ICD-10-CM

## 2018-04-26 ENCOUNTER — Encounter: Payer: Commercial Managed Care - PPO | Admitting: Family Medicine

## 2018-07-05 ENCOUNTER — Encounter

## 2018-07-05 ENCOUNTER — Encounter: Payer: Self-pay | Admitting: Family Medicine

## 2018-07-05 ENCOUNTER — Ambulatory Visit (INDEPENDENT_AMBULATORY_CARE_PROVIDER_SITE_OTHER): Payer: Commercial Managed Care - PPO | Admitting: Family Medicine

## 2018-07-05 ENCOUNTER — Other Ambulatory Visit: Payer: Self-pay

## 2018-07-05 VITALS — BP 124/67 | HR 72 | Temp 98.5°F | Resp 18 | Ht 66.0 in | Wt 163.6 lb

## 2018-07-05 DIAGNOSIS — Z0000 Encounter for general adult medical examination without abnormal findings: Secondary | ICD-10-CM

## 2018-07-05 NOTE — Patient Instructions (Signed)
Preventive Care 40-64 Years, Female Preventive care refers to lifestyle choices and visits with your health care provider that can promote health and wellness. What does preventive care include?   A yearly physical exam. This is also called an annual well check.  Dental exams once or twice a year.  Routine eye exams. Ask your health care provider how often you should have your eyes checked.  Personal lifestyle choices, including: ? Daily care of your teeth and gums. ? Regular physical activity. ? Eating a healthy diet. ? Avoiding tobacco and drug use. ? Limiting alcohol use. ? Practicing safe sex. ? Taking low-dose aspirin daily starting at age 50. ? Taking vitamin and mineral supplements as recommended by your health care provider. What happens during an annual well check? The services and screenings done by your health care provider during your annual well check will depend on your age, overall health, lifestyle risk factors, and family history of disease. Counseling Your health care provider may ask you questions about your:  Alcohol use.  Tobacco use.  Drug use.  Emotional well-being.  Home and relationship well-being.  Sexual activity.  Eating habits.  Work and work environment.  Method of birth control.  Menstrual cycle.  Pregnancy history. Screening You may have the following tests or measurements:  Height, weight, and BMI.  Blood pressure.  Lipid and cholesterol levels. These may be checked every 5 years, or more frequently if you are over 50 years old.  Skin check.  Lung cancer screening. You may have this screening every year starting at age 55 if you have a 30-pack-year history of smoking and currently smoke or have quit within the past 15 years.  Colorectal cancer screening. All adults should have this screening starting at age 50 and continuing until age 75. Your health care provider may recommend screening at age 45. You will have tests every  1-10 years, depending on your results and the type of screening test. People at increased risk should start screening at an earlier age. Screening tests may include: ? Guaiac-based fecal occult blood testing. ? Fecal immunochemical test (FIT). ? Stool DNA test. ? Virtual colonoscopy. ? Sigmoidoscopy. During this test, a flexible tube with a tiny camera (sigmoidoscope) is used to examine your rectum and lower colon. The sigmoidoscope is inserted through your anus into your rectum and lower colon. ? Colonoscopy. During this test, a long, thin, flexible tube with a tiny camera (colonoscope) is used to examine your entire colon and rectum.  Hepatitis C blood test.  Hepatitis B blood test.  Sexually transmitted disease (STD) testing.  Diabetes screening. This is done by checking your blood sugar (glucose) after you have not eaten for a while (fasting). You may have this done every 1-3 years.  Mammogram. This may be done every 1-2 years. Talk to your health care provider about when you should start having regular mammograms. This may depend on whether you have a family history of breast cancer.  BRCA-related cancer screening. This may be done if you have a family history of breast, ovarian, tubal, or peritoneal cancers.  Pelvic exam and Pap test. This may be done every 3 years starting at age 21. Starting at age 30, this may be done every 5 years if you have a Pap test in combination with an HPV test.  Bone density scan. This is done to screen for osteoporosis. You may have this scan if you are at high risk for osteoporosis. Discuss your test results, treatment options,   and if necessary, the need for more tests with your health care provider. Vaccines Your health care provider may recommend certain vaccines, such as:  Influenza vaccine. This is recommended every year.  Tetanus, diphtheria, and acellular pertussis (Tdap, Td) vaccine. You may need a Td booster every 10 years.  Varicella  vaccine. You may need this if you have not been vaccinated.  Zoster vaccine. You may need this after age 38.  Measles, mumps, and rubella (MMR) vaccine. You may need at least one dose of MMR if you were born in 1957 or later. You may also need a second dose.  Pneumococcal 13-valent conjugate (PCV13) vaccine. You may need this if you have certain conditions and were not previously vaccinated.  Pneumococcal polysaccharide (PPSV23) vaccine. You may need one or two doses if you smoke cigarettes or if you have certain conditions.  Meningococcal vaccine. You may need this if you have certain conditions.  Hepatitis A vaccine. You may need this if you have certain conditions or if you travel or work in places where you may be exposed to hepatitis A.  Hepatitis B vaccine. You may need this if you have certain conditions or if you travel or work in places where you may be exposed to hepatitis B.  Haemophilus influenzae type b (Hib) vaccine. You may need this if you have certain conditions. Talk to your health care provider about which screenings and vaccines you need and how often you need them. This information is not intended to replace advice given to you by your health care provider. Make sure you discuss any questions you have with your health care provider. Document Released: 02/01/2015 Document Revised: 02/25/2017 Document Reviewed: 11/06/2014 Elsevier Interactive Patient Education  2019 [employer].

## 2018-07-05 NOTE — Progress Notes (Signed)
Subjective:     Angela Harrington is a 48 y.o. female and is here for a comprehensive physical exam. The patient reports no problems.  Social History   Socioeconomic History  . Marital status: Married    Spouse name: Not on file  . Number of children: 0  . Years of education: Not on file  . Highest education level: Not on file  Occupational History  . Occupation: Haematologist: [employer]  . Financial resource strain: Not on file  . Food insecurity    Worry: Not on file    Inability: Not on file  . Transportation needs    Medical: Not on file    Non-medical: Not on file  Tobacco Use  . Smoking status: Never Smoker  . Smokeless tobacco: Never Used  Substance and Sexual Activity  . Alcohol use: Yes    Comment: Socially  . Drug use: No  . Sexual activity: Not on file  Lifestyle  . Physical activity    Days per week: Not on file    Minutes per session: Not on file  . Stress: Not on file  Relationships  . Social Herbalist on phone: Not on file    Gets together: Not on file    Attends religious service: Not on file    Active member of club or organization: Not on file    Attends meetings of clubs or organizations: Not on file    Relationship status: Not on file  . Intimate partner violence    Fear of current or ex partner: Not on file    Emotionally abused: Not on file    Physically abused: Not on file    Forced sexual activity: Not on file  Other Topics Concern  . Not on file  Social History Narrative   Regular exercise- no   Health Maintenance  Topic Date Due  . HIV Screening  02/20/2023 (Originally 05/18/1987)  . INFLUENZA VACCINE  10/23/2023 (Originally 08/20/2018)  . MAMMOGRAM  08/28/2018  . PAP SMEAR-Modifier  12/19/2020  . TETANUS/TDAP  09/29/2024    The following portions of the patient's history were reviewed and updated as appropriate: allergies, current medications, past family history, past medical history,  past social history, past surgical history and problem list.  Review of Systems Review of Systems  Constitutional: Negative for activity change, appetite change and fatigue.  HENT: Negative for hearing loss, congestion, tinnitus and ear discharge.  dentist q36m Eyes: Negative for visual disturbance (see optho q1y -- vision corrected to 20/20 with glasses).  Respiratory: Negative for cough, chest tightness and shortness of breath.   Cardiovascular: Negative for chest pain, palpitations and leg swelling.  Gastrointestinal: Negative for abdominal pain, diarrhea, constipation and abdominal distention.  Genitourinary: Negative for urgency, frequency, decreased urine volume and difficulty urinating.  Musculoskeletal: Negative for back pain, arthralgias and gait problem.  Skin: Negative for color change, pallor and rash.  Neurological: Negative for dizziness, light-headedness, numbness and headaches.  Hematological: Negative for adenopathy. Does not bruise/bleed easily.  Psychiatric/Behavioral: Negative for suicidal ideas, confusion, sleep disturbance, self-injury, dysphoric mood, decreased concentration and agitation.       Objective:    BP 124/67 (BP Location: Left Arm, Patient Position: Sitting, Cuff Size: Normal)   Pulse 72   Temp 98.5 F (36.9 C) (Oral)   Resp 18   Ht 5\' 6"  (1.676 m)   Wt 163 lb  9.6 oz (74.2 kg)   LMP 06/21/2018   SpO2 100%   BMI 26.41 kg/m  General appearance: alert, cooperative, appears stated age and no distress Head: Normocephalic, without obvious abnormality, atraumatic Eyes: conjunctivae/corneas clear. PERRL, EOM's intact. Fundi benign. Ears: normal TM's and external ear canals both ears Nose: Nares normal. Septum midline. Mucosa normal. No drainage or sinus tenderness. Throat: lips, mucosa, and tongue normal; teeth and gums normal Neck: no adenopathy, no carotid bruit, no JVD, supple, symmetrical, trachea midline and thyroid not enlarged, symmetric, no  tenderness/mass/nodules Back: symmetric, no curvature. ROM normal. No CVA tenderness. Lungs: clear to auscultation bilaterally Breasts: gyn Heart: regular rate and rhythm, S1, S2 normal, no murmur, click, rub or gallop Abdomen: soft, non-tender; bowel sounds normal; no masses,  no organomegaly Pelvic: deferred-gyn Extremities: extremities normal, atraumatic, no cyanosis or edema Pulses: 2+ and symmetric Skin: Skin color, texture, turgor normal. No rashes or lesions Lymph nodes: Cervical, supraclavicular, and axillary nodes normal. Neurologic: Alert and oriented X 3, normal strength and tone. Normal symmetric reflexes. Normal coordination and gait    Assessment:    Healthy female exam.     Plan:     ghm utd Check labs  See After Visit Summary for Counseling Recommendations    1. Preventative health care See above - TSH - Lipid panel - CBC with Differential/Platelet - Comprehensive metabolic panel

## 2018-07-06 LAB — CBC WITH DIFFERENTIAL/PLATELET
Basophils Absolute: 0 10*3/uL (ref 0.0–0.1)
Basophils Relative: 0.6 % (ref 0.0–3.0)
Eosinophils Absolute: 0.1 10*3/uL (ref 0.0–0.7)
Eosinophils Relative: 1 % (ref 0.0–5.0)
HCT: 39.5 % (ref 36.0–46.0)
Hemoglobin: 13.1 g/dL (ref 12.0–15.0)
Lymphocytes Relative: 26.5 % (ref 12.0–46.0)
Lymphs Abs: 1.7 10*3/uL (ref 0.7–4.0)
MCHC: 33.1 g/dL (ref 30.0–36.0)
MCV: 91.7 fl (ref 78.0–100.0)
Monocytes Absolute: 0.4 10*3/uL (ref 0.1–1.0)
Monocytes Relative: 5.9 % (ref 3.0–12.0)
Neutro Abs: 4.2 10*3/uL (ref 1.4–7.7)
Neutrophils Relative %: 66 % (ref 43.0–77.0)
Platelets: 239 10*3/uL (ref 150.0–400.0)
RBC: 4.31 Mil/uL (ref 3.87–5.11)
RDW: 13.7 % (ref 11.5–15.5)
WBC: 6.3 10*3/uL (ref 4.0–10.5)

## 2018-07-06 LAB — COMPREHENSIVE METABOLIC PANEL
ALT: 9 U/L (ref 0–35)
AST: 12 U/L (ref 0–37)
Albumin: 4.3 g/dL (ref 3.5–5.2)
Alkaline Phosphatase: 52 U/L (ref 39–117)
BUN: 12 mg/dL (ref 6–23)
CO2: 24 mEq/L (ref 19–32)
Calcium: 8.8 mg/dL (ref 8.4–10.5)
Chloride: 102 mEq/L (ref 96–112)
Creatinine, Ser: 0.84 mg/dL (ref 0.40–1.20)
GFR: 72.95 mL/min (ref >60.00)
Glucose, Bld: 76 mg/dL (ref 70–99)
Potassium: 4 mEq/L (ref 3.5–5.1)
Sodium: 137 mEq/L (ref 135–145)
Total Bilirubin: 0.6 mg/dL (ref 0.2–1.2)
Total Protein: 6.8 g/dL (ref 6.0–8.3)

## 2018-07-06 LAB — LIPID PANEL
Cholesterol: 208 mg/dL — ABNORMAL HIGH (ref 0–200)
HDL: 75.3 mg/dL (ref >39.00)
LDL Cholesterol: 117 mg/dL — ABNORMAL HIGH (ref 0–99)
NonHDL: 132.54
Total CHOL/HDL Ratio: 3
Triglycerides: 79 mg/dL (ref 0.0–149.0)
VLDL: 15.8 mg/dL (ref 0.0–40.0)

## 2018-07-06 LAB — TSH: TSH: 1.44 u[IU]/mL (ref 0.35–4.50)

## 2018-09-09 ENCOUNTER — Encounter: Payer: Self-pay | Admitting: Family Medicine

## 2018-09-14 ENCOUNTER — Telehealth: Payer: Self-pay

## 2018-09-14 NOTE — Telephone Encounter (Signed)
Form Completed and faxed. Received confirmation.

## 2018-09-29 ENCOUNTER — Other Ambulatory Visit: Payer: Self-pay | Admitting: Family Medicine

## 2018-09-29 DIAGNOSIS — Z1231 Encounter for screening mammogram for malignant neoplasm of breast: Secondary | ICD-10-CM

## 2018-10-26 ENCOUNTER — Other Ambulatory Visit: Payer: Self-pay | Admitting: *Deleted

## 2018-10-26 DIAGNOSIS — Z20822 Contact with and (suspected) exposure to covid-19: Secondary | ICD-10-CM

## 2018-10-27 LAB — NOVEL CORONAVIRUS, NAA: SARS-CoV-2, NAA: NOT DETECTED

## 2018-11-11 ENCOUNTER — Ambulatory Visit
Admission: RE | Admit: 2018-11-11 | Discharge: 2018-11-11 | Disposition: A | Discharge status: Home / Self Care | Payer: Commercial Managed Care - PPO | Source: Ambulatory Visit | Attending: Family Medicine | Admitting: Family Medicine

## 2018-11-11 ENCOUNTER — Other Ambulatory Visit: Payer: Self-pay

## 2018-11-11 ENCOUNTER — Encounter: Payer: Self-pay | Admitting: Family Medicine

## 2018-11-11 DIAGNOSIS — F329 Major depressive disorder, single episode, unspecified: Secondary | ICD-10-CM

## 2018-11-11 DIAGNOSIS — Z1231 Encounter for screening mammogram for malignant neoplasm of breast: Secondary | ICD-10-CM

## 2018-11-11 DIAGNOSIS — F32A Depression, unspecified: Secondary | ICD-10-CM

## 2018-11-11 DIAGNOSIS — F418 Other specified anxiety disorders: Secondary | ICD-10-CM

## 2018-11-12 MED ORDER — BUPROPION HCL ER (XL) 300 MG PO TB24: 300.0000 mg | ORAL_TABLET | Freq: Every day | ORAL | 3 refills | Status: DC

## 2018-11-12 MED ORDER — CITALOPRAM HYDROBROMIDE 10 MG PO TABS: 10.0000 mg | ORAL_TABLET | Freq: Every day | ORAL | 3 refills | Status: DC

## 2019-03-09 ENCOUNTER — Encounter: Payer: Self-pay | Admitting: Family Medicine

## 2019-03-13 ENCOUNTER — Ambulatory Visit: Payer: Commercial Managed Care - PPO | Attending: Internal Medicine

## 2019-03-13 DIAGNOSIS — Z20822 Contact with and (suspected) exposure to covid-19: Secondary | ICD-10-CM

## 2019-03-14 LAB — NOVEL CORONAVIRUS, NAA: SARS-CoV-2, NAA: NOT DETECTED

## 2019-03-30 ENCOUNTER — Ambulatory Visit: Payer: Commercial Managed Care - PPO | Attending: Internal Medicine

## 2019-03-30 DIAGNOSIS — Z23 Encounter for immunization: Secondary | ICD-10-CM

## 2019-03-30 NOTE — Progress Notes (Signed)
   Covid-19 Vaccination Clinic  Name:  Angela Harrington    MRN: [id number] DOB: [birth year]  03/30/2019  [name] was observed post Covid-19 immunization for 15 minutes without incident. She was provided with Vaccine Information Sheet and instruction to access the V-Safe system.   [name] was instructed to call 911 with any severe reactions post vaccine: Marland Kitchen Difficulty breathing  . Swelling of face and throat  . A fast heartbeat  . A bad rash all over body  . Dizziness and weakness   Immunizations Administered    Name Date Dose VIS Date Route   Pfizer COVID-19 Vaccine 03/30/2019 10:05 AM 0.3 mL 12/30/2018 Intramuscular   Manufacturer: [place]   Lot: [id number]   [place]: [id number]

## 2019-04-26 ENCOUNTER — Ambulatory Visit: Payer: Commercial Managed Care - PPO | Attending: Internal Medicine

## 2019-04-26 DIAGNOSIS — Z23 Encounter for immunization: Secondary | ICD-10-CM

## 2019-04-26 NOTE — Progress Notes (Signed)
   Covid-19 Vaccination Clinic  Name:  Angela Harrington    MRN: [id number] DOB: 12-19-1970  04/26/2019  Angela Harrington was observed post Covid-19 immunization for 15 minutes without incident. She was provided with Vaccine Information Sheet and instruction to access the V-Safe system.   Angela Harrington was instructed to call 911 with any severe reactions post vaccine: Marland Kitchen Difficulty breathing  . Swelling of face and throat  . A fast heartbeat  . A bad rash all over body  . Dizziness and weakness   Immunizations Administered    Name Date Dose VIS Date Route   Pfizer COVID-19 Vaccine 04/26/2019  9:56 AM 0.3 mL 12/30/2018 Intramuscular   Manufacturer: [employer]   Lot: [id number]   [place]: [id number]

## 2019-10-31 ENCOUNTER — Other Ambulatory Visit: Payer: Self-pay | Admitting: Family Medicine

## 2019-10-31 DIAGNOSIS — Z1231 Encounter for screening mammogram for malignant neoplasm of breast: Secondary | ICD-10-CM

## 2019-11-11 ENCOUNTER — Other Ambulatory Visit: Payer: Self-pay | Admitting: Family Medicine

## 2019-11-11 DIAGNOSIS — F418 Other specified anxiety disorders: Secondary | ICD-10-CM

## 2019-11-19 ENCOUNTER — Other Ambulatory Visit: Payer: Self-pay | Admitting: Family Medicine

## 2019-11-19 DIAGNOSIS — F32A Depression, unspecified: Secondary | ICD-10-CM

## 2019-11-26 ENCOUNTER — Other Ambulatory Visit: Payer: Self-pay | Admitting: Family Medicine

## 2019-11-26 DIAGNOSIS — F32A Depression, unspecified: Secondary | ICD-10-CM

## 2019-11-27 ENCOUNTER — Other Ambulatory Visit: Payer: Self-pay | Admitting: Family Medicine

## 2019-11-27 DIAGNOSIS — F418 Other specified anxiety disorders: Secondary | ICD-10-CM

## 2019-11-28 ENCOUNTER — Ambulatory Visit
Admission: RE | Admit: 2019-11-28 | Discharge: 2019-11-28 | Disposition: A | Discharge status: Home / Self Care | Payer: Commercial Managed Care - PPO | Source: Ambulatory Visit | Attending: Family Medicine | Admitting: Family Medicine

## 2019-11-28 ENCOUNTER — Other Ambulatory Visit: Payer: Self-pay

## 2019-11-28 DIAGNOSIS — Z1231 Encounter for screening mammogram for malignant neoplasm of breast: Secondary | ICD-10-CM

## 2019-12-22 ENCOUNTER — Other Ambulatory Visit: Payer: Self-pay | Admitting: Family Medicine

## 2019-12-22 DIAGNOSIS — F32A Depression, unspecified: Secondary | ICD-10-CM

## 2019-12-23 ENCOUNTER — Other Ambulatory Visit: Payer: Self-pay | Admitting: Family Medicine

## 2019-12-23 DIAGNOSIS — F418 Other specified anxiety disorders: Secondary | ICD-10-CM

## 2020-01-11 ENCOUNTER — Other Ambulatory Visit: Payer: Self-pay | Admitting: Family Medicine

## 2020-01-11 DIAGNOSIS — F418 Other specified anxiety disorders: Secondary | ICD-10-CM

## 2020-01-26 ENCOUNTER — Other Ambulatory Visit: Payer: Self-pay | Admitting: Family Medicine

## 2020-01-26 DIAGNOSIS — F32A Depression, unspecified: Secondary | ICD-10-CM

## 2020-01-27 ENCOUNTER — Other Ambulatory Visit: Payer: Self-pay | Admitting: Family Medicine

## 2020-01-27 DIAGNOSIS — F32A Depression, unspecified: Secondary | ICD-10-CM

## 2020-02-24 ENCOUNTER — Other Ambulatory Visit: Payer: Self-pay | Admitting: Family Medicine

## 2020-02-24 DIAGNOSIS — F32A Depression, unspecified: Secondary | ICD-10-CM

## 2020-05-01 ENCOUNTER — Other Ambulatory Visit: Payer: Self-pay | Admitting: Family Medicine

## 2020-05-01 DIAGNOSIS — F32A Depression, unspecified: Secondary | ICD-10-CM

## 2020-05-23 ENCOUNTER — Other Ambulatory Visit: Payer: Self-pay | Admitting: Family Medicine

## 2020-05-23 DIAGNOSIS — F32A Depression, unspecified: Secondary | ICD-10-CM

## 2020-06-18 ENCOUNTER — Other Ambulatory Visit: Payer: Self-pay | Admitting: Family Medicine

## 2020-06-18 DIAGNOSIS — F32A Depression, unspecified: Secondary | ICD-10-CM

## 2020-07-14 ENCOUNTER — Other Ambulatory Visit: Payer: Self-pay | Admitting: Family Medicine

## 2020-07-14 DIAGNOSIS — F418 Other specified anxiety disorders: Secondary | ICD-10-CM

## 2020-07-20 ENCOUNTER — Other Ambulatory Visit: Payer: Self-pay | Admitting: Family Medicine

## 2020-07-20 DIAGNOSIS — F32A Depression, unspecified: Secondary | ICD-10-CM

## 2020-07-24 ENCOUNTER — Other Ambulatory Visit: Payer: Self-pay | Admitting: Family Medicine

## 2020-07-24 DIAGNOSIS — F32A Depression, unspecified: Secondary | ICD-10-CM

## 2020-08-09 ENCOUNTER — Other Ambulatory Visit: Payer: Self-pay | Admitting: Family Medicine

## 2020-08-09 DIAGNOSIS — F418 Other specified anxiety disorders: Secondary | ICD-10-CM

## 2020-08-11 ENCOUNTER — Other Ambulatory Visit: Payer: Self-pay | Admitting: Family Medicine

## 2020-08-11 DIAGNOSIS — F32A Depression, unspecified: Secondary | ICD-10-CM

## 2020-08-20 ENCOUNTER — Encounter: Payer: Self-pay | Admitting: Family Medicine

## 2020-08-23 ENCOUNTER — Ambulatory Visit: Payer: Commercial Managed Care - PPO | Admitting: Family Medicine

## 2020-09-06 ENCOUNTER — Other Ambulatory Visit: Payer: Self-pay

## 2020-09-06 ENCOUNTER — Encounter: Payer: Self-pay | Admitting: Family Medicine

## 2020-09-06 ENCOUNTER — Ambulatory Visit (INDEPENDENT_AMBULATORY_CARE_PROVIDER_SITE_OTHER): Payer: Commercial Managed Care - PPO | Admitting: Family Medicine

## 2020-09-06 VITALS — BP 108/70 | HR 85 | Temp 98.4°F | Resp 18 | Ht 66.0 in | Wt 161.2 lb

## 2020-09-06 DIAGNOSIS — F32A Depression, unspecified: Secondary | ICD-10-CM

## 2020-09-06 DIAGNOSIS — Z1159 Encounter for screening for other viral diseases: Secondary | ICD-10-CM | POA: Diagnosis not present

## 2020-09-06 DIAGNOSIS — Z0000 Encounter for general adult medical examination without abnormal findings: Secondary | ICD-10-CM | POA: Diagnosis not present

## 2020-09-06 DIAGNOSIS — F418 Other specified anxiety disorders: Secondary | ICD-10-CM | POA: Diagnosis not present

## 2020-09-06 DIAGNOSIS — F900 Attention-deficit hyperactivity disorder, predominantly inattentive type: Secondary | ICD-10-CM

## 2020-09-06 LAB — COMPREHENSIVE METABOLIC PANEL
ALT: 21 U/L (ref 0–35)
AST: 14 U/L (ref 0–37)
Albumin: 4.5 g/dL (ref 3.5–5.2)
Alkaline Phosphatase: 54 U/L (ref 39–117)
BUN: 12 mg/dL (ref 6–23)
CO2: 26 mEq/L (ref 19–32)
Calcium: 9.5 mg/dL (ref 8.4–10.5)
Chloride: 103 mEq/L (ref 96–112)
Creatinine, Ser: 0.93 mg/dL (ref 0.40–1.20)
GFR: 72.78 mL/min (ref >60.00)
Glucose, Bld: 86 mg/dL (ref 70–99)
Potassium: 3.9 mEq/L (ref 3.5–5.1)
Sodium: 138 mEq/L (ref 135–145)
Total Bilirubin: 0.9 mg/dL (ref 0.2–1.2)
Total Protein: 7.2 g/dL (ref 6.0–8.3)

## 2020-09-06 LAB — CBC WITH DIFFERENTIAL/PLATELET
Basophils Absolute: 0 10*3/uL (ref 0.0–0.1)
Basophils Relative: 0.3 % (ref 0.0–3.0)
Eosinophils Absolute: 0.1 10*3/uL (ref 0.0–0.7)
Eosinophils Relative: 0.9 % (ref 0.0–5.0)
HCT: 39.7 % (ref 36.0–46.0)
Hemoglobin: 13.4 g/dL (ref 12.0–15.0)
Lymphocytes Relative: 22.5 % (ref 12.0–46.0)
Lymphs Abs: 1.6 10*3/uL (ref 0.7–4.0)
MCHC: 33.6 g/dL (ref 30.0–36.0)
MCV: 90.1 fl (ref 78.0–100.0)
Monocytes Absolute: 0.4 10*3/uL (ref 0.1–1.0)
Monocytes Relative: 6 % (ref 3.0–12.0)
Neutro Abs: 4.9 10*3/uL (ref 1.4–7.7)
Neutrophils Relative %: 70.3 % (ref 43.0–77.0)
Platelets: 264 10*3/uL (ref 150.0–400.0)
RBC: 4.41 Mil/uL (ref 3.87–5.11)
RDW: 13.5 % (ref 11.5–15.5)
WBC: 7 10*3/uL (ref 4.0–10.5)

## 2020-09-06 LAB — LIPID PANEL
Cholesterol: 223 mg/dL — ABNORMAL HIGH (ref 0–200)
HDL: 68.2 mg/dL (ref >39.00)
LDL Cholesterol: 144 mg/dL — ABNORMAL HIGH (ref 0–99)
NonHDL: 154.94
Total CHOL/HDL Ratio: 3
Triglycerides: 53 mg/dL (ref 0.0–149.0)
VLDL: 10.6 mg/dL (ref 0.0–40.0)

## 2020-09-06 LAB — TSH: TSH: 2.28 u[IU]/mL (ref 0.35–5.50)

## 2020-09-06 LAB — HEMOGLOBIN A1C: Hgb A1c MFr Bld: 5.7 % (ref 4.6–6.5)

## 2020-09-06 MED ORDER — BUPROPION HCL ER (XL) 150 MG PO TB24: 150.0000 mg | ORAL_TABLET | Freq: Every day | ORAL | 1 refills | Status: DC

## 2020-09-06 MED ORDER — AMPHETAMINE-DEXTROAMPHETAMINE 10 MG PO TABS: 10.0000 mg | ORAL_TABLET | Freq: Two times a day (BID) | ORAL | 0 refills | Status: DC

## 2020-09-06 MED ORDER — CITALOPRAM HYDROBROMIDE 10 MG PO TABS: 10.0000 mg | ORAL_TABLET | Freq: Every day | ORAL | 3 refills | Status: DC

## 2020-09-06 NOTE — Assessment & Plan Note (Signed)
Dec wellbutrin to 150 daily due to being on adderall from psych con't celexa  F/u 1 year or sooner prn

## 2020-09-06 NOTE — Patient Instructions (Signed)
Preventive Care 48-48 Years Old, Female Preventive care refers to lifestyle choices and visits with your health care provider that can promote health and wellness. This includes: A yearly physical exam. This is also called an annual wellness visit. Regular dental and eye exams. Immunizations. Screening for certain conditions. Healthy lifestyle choices, such as: Eating a healthy diet. Getting regular exercise. Not using drugs or products that contain nicotine and tobacco. Limiting alcohol use. What can I expect for my preventive care visit? Physical exam Your health care provider will check your: Height and weight. These may be used to calculate your BMI (body mass index). BMI is a measurement that tells if you are at a healthy weight. Heart rate and blood pressure. Body temperature. Skin for abnormal spots. Counseling Your health care provider may ask you questions about your: Past medical problems. Family's medical history. Alcohol, tobacco, and drug use. Emotional well-being. Home life and relationship well-being. Sexual activity. Diet, exercise, and sleep habits. Work and work Statistician. Access to firearms. Method of birth control. Menstrual cycle. Pregnancy history. What immunizations do I need?  Vaccines are usually given at various ages, according to a schedule. Your health care provider will recommend vaccines for you based on your age, medicalhistory, and lifestyle or other factors, such as travel or where you work. What tests do I need? Blood tests Lipid and cholesterol levels. These may be checked every 5 years, or more often if you are over 48 years old. Hepatitis C test. Hepatitis B test. Screening Lung cancer screening. You may have this screening every year starting at age 48 if you have a 30-pack-year history of smoking and currently smoke or have quit within the past 15 years. Colorectal cancer screening. All adults should have this screening starting at  age 48 and continuing until age 3. Your health care provider may recommend screening at age 88 if you are at increased risk. You will have tests every 1-10 years, depending on your results and the type of screening test. Diabetes screening. This is done by checking your blood sugar (glucose) after you have not eaten for a while (fasting). You may have this done every 1-3 years. Mammogram. This may be done every 1-2 years. Talk with your health care provider about when you should start having regular mammograms. This may depend on whether you have a family history of breast cancer. BRCA-related cancer screening. This may be done if you have a family history of breast, ovarian, tubal, or peritoneal cancers. Pelvic exam and Pap test. This may be done every 3 years starting at age 79. Starting at age 48, this may be done every 5 years if you have a Pap test in combination with an HPV test. Other tests STD (sexually transmitted disease) testing, if you are at risk. Bone density scan. This is done to screen for osteoporosis. You may have this scan if you are at high risk for osteoporosis. Talk with your health care provider about your test results, treatment options,and if necessary, the need for more tests. Follow these instructions at home: Eating and drinking  Eat a diet that includes fresh fruits and vegetables, whole grains, lean protein, and low-fat dairy products. Take vitamin and mineral supplements as recommended by your health care provider. Do not drink alcohol if: Your health care provider tells you not to drink. You are pregnant, may be pregnant, or are planning to become pregnant. If you drink alcohol: Limit how much you have to 0-1 drink a day. Be aware  of how much alcohol is in your drink. In the [place] one drink equals one 12 oz bottle of beer (355 mL), one 5 oz glass of wine (148 mL), or one 1 oz glass of hard liquor (44 mL).  Lifestyle Take daily care of your teeth and  gums. Brush your teeth every morning and night with fluoride toothpaste. Floss one time each day. Stay active. Exercise for at least 30 minutes 5 or more days each week. Do not use any products that contain nicotine or tobacco, such as cigarettes, e-cigarettes, and chewing tobacco. If you need help quitting, ask your health care provider. Do not use drugs. If you are sexually active, practice safe sex. Use a condom or other form of protection to prevent STIs (sexually transmitted infections). If you do not wish to become pregnant, use a form of birth control. If you plan to become pregnant, see your health care provider for a prepregnancy visit. If told by your health care provider, take low-dose aspirin daily starting at age 48. Find healthy ways to cope with stress, such as: Meditation, yoga, or listening to music. Journaling. Talking to a trusted person. Spending time with friends and family. Safety Always wear your seat belt while driving or riding in a vehicle. Do not drive: If you have been drinking alcohol. Do not ride with someone who has been drinking. When you are tired or distracted. While texting. Wear a helmet and other protective equipment during sports activities. If you have firearms in your house, make sure you follow all gun safety procedures. What's next? Visit your health care provider once a year for an annual wellness visit. Ask your health care provider how often you should have your eyes and teeth checked. Stay up to date on all vaccines. This information is not intended to replace advice given to you by your health care provider. Make sure you discuss any questions you have with your healthcare provider. Document Revised: 10/10/2019 Document Reviewed: 09/16/2017 Elsevier Patient Education  2022 [employer].

## 2020-09-06 NOTE — Assessment & Plan Note (Signed)
On adderall  Per psych

## 2020-09-06 NOTE — Assessment & Plan Note (Signed)
ghm utd Check labs  See avs  

## 2020-09-06 NOTE — Progress Notes (Signed)
Subjective:   By signing my name below, I, Angela Harrington, attest that this documentation has been prepared under the direction and in the presence of Angela Held, DO. 09/06/2020   Patient ID: Angela Harrington, female    DOB: 06/17/1970, 50 y.o.   MRN: [id number]  Chief Complaint  Patient presents with   Annual Exam    Pt states fasting     HPI Patient is in today for a comprehensive physical exam.  She mentions she is been doing well.  She was recently diagnosed with ADHD and was prescribed 10 mg Adderall PO 2x prn. She has been using it for a week and is not sure how well it is working for her.  She is worried about a wart on her finger and has tried home treatments to freeze it off but the wart is still persistent.  She denies fever, hearing loss, ear pain,congestion, sinus pain, sore throat, eye pain, chest pain, palpitations, cough, shortness of breath, wheezing, nausea. vomiting, diarrhea, constipation, blood in stool, dysuria,frequency, hematuria and headaches.   She has 2 Pfizer Covid-19 vaccines at this time.  She is not UTD on vision care.  There has been no recent changes in her family medical history.There have been no recent surgeries.  Past Medical History:  Diagnosis Date   Allergy    rhinitis   Anemia as a child   Endometriosis 1995    Past Surgical History:  Procedure Laterality Date   laporoscopy for endometriosis  [place]    Family History  Problem Relation Age of Onset   Cancer Other        breast   Arthritis Mother        osteoarthritis   Hyperlipidemia Mother    Alcohol abuse Father    Arthritis Father        rheumatoid   Hypertension Father    Breast cancer Sister     Social History   Socioeconomic History   Marital status: Married    Spouse name: Not on file   Number of children: 0   Years of education: Not on file   Highest education level: Not on file  Occupational History   Occupation: Sales executive: [employer]  Tobacco Use   Smoking status: Never   Smokeless tobacco: Never  Substance and Sexual Activity   Alcohol use: Yes    Comment: Socially   Drug use: No   Sexual activity: Not on file  Other Topics Concern   Not on file  Social History Narrative   Regular exercise- no   Social Determinants of Health   Financial Resource Strain: Not on file  Food Insecurity: Not on file  Transportation Needs: Not on file  Physical Activity: Not on file  Stress: Not on file  Social Connections: Not on file  Intimate Partner Violence: Not on file    Outpatient Medications Prior to Visit  Medication Sig Dispense Refill   Levonorgestrel-Ethinyl Estrad (LARISSIA PO) Take 1 tablet by mouth daily.     buPROPion (WELLBUTRIN XL) 300 MG 24 hr tablet TAKE 1 TABLET BY MOUTH EVERY DAY. PT NEEDS OFFICE VISIT FOR FURTHER REFILLS 14 tablet 0   citalopram (CELEXA) 10 MG tablet TAKE 1 TABLET (10 MG TOTAL) BY MOUTH DAILY. PT NEEDS OV FOR FURTHER REFILLS 14 tablet 0   No facility-administered medications prior to visit.    No Known Allergies  Review of Systems  Constitutional:  Negative for fever.  HENT:  Negative for congestion, ear pain, hearing loss, sinus pain and sore throat.   Eyes:  Negative for pain.  Respiratory:  Negative for cough, shortness of breath and wheezing.   Cardiovascular:  Negative for chest pain and palpitations.  Gastrointestinal:  Negative for blood in stool, constipation, diarrhea, nausea and vomiting.  Genitourinary:  Negative for dysuria, frequency and hematuria.  Neurological:  Negative for headaches.      Objective:    Physical Exam Constitutional:      General: She is not in acute distress.    Appearance: Normal appearance. She is not ill-appearing.  HENT:     Head: Normocephalic and atraumatic.     Right Ear: Tympanic membrane, ear canal and external ear normal.     Left Ear: Tympanic membrane, ear canal and external ear normal.  Eyes:      Extraocular Movements: Extraocular movements intact.     Pupils: Pupils are equal, round, and reactive to light.  Cardiovascular:     Rate and Rhythm: Normal rate and regular rhythm.     Pulses: Normal pulses.     Heart sounds: Normal heart sounds. No murmur heard.   No gallop.  Pulmonary:     Effort: Pulmonary effort is normal. No respiratory distress.     Breath sounds: Normal breath sounds. No wheezing, rhonchi or rales.  Abdominal:     General: Bowel sounds are normal. There is no distension.     Palpations: Abdomen is soft. There is no mass.     Tenderness: There is no abdominal tenderness. There is no guarding or rebound.     Hernia: No hernia is present.  Musculoskeletal:     Cervical back: Normal range of motion and neck supple.  Lymphadenopathy:     Cervical: No cervical adenopathy.  Skin:    General: Skin is warm and dry.  Neurological:     Mental Status: She is alert and oriented to person, place, and time.  Psychiatric:        Behavior: Behavior normal.    BP 108/70 (BP Location: Left Arm, Patient Position: Sitting, Cuff Size: Normal)   Pulse 85   Temp 98.4 F (36.9 C) (Oral)   Resp 18   Ht '5\' 6"'$  (1.676 m)   Wt 161 lb 3.2 oz (73.1 kg)   SpO2 98%   BMI 26.02 kg/m  Wt Readings from Last 3 Encounters:  09/06/20 161 lb 3.2 oz (73.1 kg)  07/05/18 163 lb 9.6 oz (74.2 kg)  09/21/17 152 lb (68.9 kg)    Diabetic Foot Exam - Simple   No data filed    Lab Results  Component Value Date   WBC 6.3 07/05/2018   HGB 13.1 07/05/2018   HCT 39.5 07/05/2018   PLT 239.0 07/05/2018   GLUCOSE 76 07/05/2018   CHOL 208 (H) 07/05/2018   TRIG 79.0 07/05/2018   HDL 75.30 07/05/2018   LDLDIRECT 141.7 11/18/2011   LDLCALC 117 (H) 07/05/2018   ALT 9 07/05/2018   AST 12 07/05/2018   NA 137 07/05/2018   K 4.0 07/05/2018   CL 102 07/05/2018   CREATININE 0.84 07/05/2018   BUN 12 07/05/2018   CO2 24 07/05/2018   TSH 1.44 07/05/2018    Lab Results  Component Value Date    TSH 1.44 07/05/2018   Lab Results  Component Value Date   WBC 6.3 07/05/2018   HGB 13.1 07/05/2018   HCT 39.5 07/05/2018  MCV 91.7 07/05/2018   PLT 239.0 07/05/2018   Lab Results  Component Value Date   NA 137 07/05/2018   K 4.0 07/05/2018   CO2 24 07/05/2018   GLUCOSE 76 07/05/2018   BUN 12 07/05/2018   CREATININE 0.84 07/05/2018   BILITOT 0.6 07/05/2018   ALKPHOS 52 07/05/2018   AST 12 07/05/2018   ALT 9 07/05/2018   PROT 6.8 07/05/2018   ALBUMIN 4.3 07/05/2018   CALCIUM 8.8 07/05/2018   ANIONGAP 9 09/30/2014   GFR 72.95 07/05/2018   Lab Results  Component Value Date   CHOL 208 (H) 07/05/2018   Lab Results  Component Value Date   HDL 75.30 07/05/2018   Lab Results  Component Value Date   LDLCALC 117 (H) 07/05/2018   Lab Results  Component Value Date   TRIG 79.0 07/05/2018   Lab Results  Component Value Date   CHOLHDL 3 07/05/2018   No results found for: HGBA1C      Mammogram: Last completed on 11/28/2019. Results were normal. Repeat in 1 year. Colonoscopy: Last completed on 06/09/2013. Results showed moderate diverticulosis in the colon, polyps that were resected and retrieved and prominent internal hemorrhoids. Repeat in 10 years.  Pap Smear: Last completed 12/19/2017. Results were normal. Repeat in 3 years.  Assessment & Plan:   Problem List Items Addressed This Visit       Unprioritized   Attention deficit hyperactivity disorder (ADHD), predominantly inattentive type    On adderall  Per psych      Relevant Medications   amphetamine-dextroamphetamine (ADDERALL) 10 MG tablet   Depression with anxiety    Dec wellbutrin to 150 daily due to being on adderall from psych con't celexa  F/u 1 year or sooner prn       Relevant Medications   buPROPion (WELLBUTRIN XL) 150 MG 24 hr tablet   citalopram (CELEXA) 10 MG tablet   Preventative health care - Primary    ghm utd Check labs  See avs      Relevant Orders   Lipid panel   CBC with  Differential/Platelet   TSH   Comprehensive metabolic panel   Hemoglobin A1c   Hepatitis C antibody   Other Visit Diagnoses     Need for hepatitis C screening test       Relevant Orders   Hepatitis C antibody   Depression       Relevant Medications   buPROPion (WELLBUTRIN XL) 150 MG 24 hr tablet   citalopram (CELEXA) 10 MG tablet        Meds ordered this encounter  Medications   amphetamine-dextroamphetamine (ADDERALL) 10 MG tablet    Sig: Take 1 tablet (10 mg total) by mouth 2 (two) times daily.    Dispense:  60 tablet    Refill:  0   buPROPion (WELLBUTRIN XL) 150 MG 24 hr tablet    Sig: Take 1 tablet (150 mg total) by mouth daily.    Dispense:  90 tablet    Refill:  1   citalopram (CELEXA) 10 MG tablet    Sig: Take 1 tablet (10 mg total) by mouth daily. Pt needs OV for further refills    Dispense:  90 tablet    Refill:  3    I,Angela Harrington,acting as a scribe for [employer], DO.,have documented all relevant documentation on the behalf of Angela Held, DO,as directed by  Angela Held, DO while in the presence of Rosalita Chessman  Chase, [place], DO. , personally preformed the services described in this documentation.  All medical record entries made by the scribe were at my direction and in my presence.  I have reviewed the chart and discharge instructions (if applicable) and agree that the record reflects my personal performance and is accurate and complete. 09/06/2020

## 2020-09-08 ENCOUNTER — Other Ambulatory Visit: Payer: Self-pay | Admitting: Family Medicine

## 2020-09-08 DIAGNOSIS — E785 Hyperlipidemia, unspecified: Secondary | ICD-10-CM

## 2020-09-09 ENCOUNTER — Telehealth: Payer: Self-pay

## 2020-09-09 LAB — HEPATITIS C ANTIBODY
Hepatitis C Ab: NONREACTIVE
SIGNAL TO CUT-OFF: 0 (ref <1.00)

## 2020-09-09 NOTE — Telephone Encounter (Signed)
Health screening form completed and faxed. Form sent to scan.

## 2021-02-13 ENCOUNTER — Telehealth: Payer: Commercial Managed Care - PPO | Admitting: Family Medicine

## 2021-02-13 DIAGNOSIS — J069 Acute upper respiratory infection, unspecified: Secondary | ICD-10-CM

## 2021-02-13 MED ORDER — IPRATROPIUM BROMIDE 0.03 % NA SOLN: 2.0000 | Freq: Two times a day (BID) | NASAL | 0 refills | Status: DC

## 2021-02-13 MED ORDER — BENZONATATE 100 MG PO CAPS: 100.0000 mg | ORAL_CAPSULE | Freq: Two times a day (BID) | ORAL | 0 refills | Status: DC | PRN

## 2021-02-13 NOTE — Patient Instructions (Signed)

## 2021-02-13 NOTE — Progress Notes (Signed)
Virtual Visit Consent   Angela Harrington, you are scheduled for a virtual visit with a [facility] provider today.     Just as with appointments in the office, your consent must be obtained to participate.  Your consent will be active for this visit and any virtual visit you may have with one of our providers in the next 365 days.     If you have a MyChart account, a copy of this consent can be sent to you electronically.  All virtual visits are billed to your insurance company just like a traditional visit in the office.    As this is a virtual visit, video technology does not allow for your provider to perform a traditional examination.  This may limit your provider's ability to fully assess your condition.  If your provider identifies any concerns that need to be evaluated in person or the need to arrange testing (such as labs, EKG, etc.), we will make arrangements to do so.     Although advances in technology are sophisticated, we cannot ensure that it will always work on either your end or our end.  If the connection with a video visit is poor, the visit may have to be switched to a telephone visit.  With either a video or telephone visit, we are not always able to ensure that we have a secure connection.     I need to obtain your verbal consent now.   Are you willing to proceed with your visit today?    Angela Harrington has provided verbal consent on 02/13/2021 for a virtual visit (video or telephone).   Perlie Mayo, NP   Date: 02/13/2021 1:48 PM   Virtual Visit via Video Note   I, Perlie Mayo, connected with  Angela Harrington  ([id number], [birth year]) on 02/13/21 at  1:45 PM EST by a video-enabled telemedicine application and verified that I am speaking with the correct person using two identifiers.  Location: Patient: Virtual Visit Location Patient: Home Provider: Virtual Visit Location Provider: Home Office   I discussed the limitations of evaluation and management by telemedicine  and the availability of in person appointments. The patient expressed understanding and agreed to proceed.    History of Present Illness: Angela Harrington is a 51 y.o. who identifies as a female who was assigned female at birth, and is being seen today for sinus infection symptoms.   Sinus infection/virus recently around some co workers with it.   HPI: Sinus Problem This is a new problem. The current episode started in the past 7 days. The problem has been waxing and waning since onset. There has been no fever. The fever has been present for Less than 1 day. Associated symptoms include congestion, coughing, ear pain, headaches, sinus pressure, sneezing and a sore throat. Pertinent negatives include no chills or shortness of breath. Treatments tried: advil. The treatment provided mild relief.   Problems:  Patient Active Problem List   Diagnosis Date Noted   Preventative health care 09/06/2020   Attention deficit hyperactivity disorder (ADHD), predominantly inattentive type 09/06/2020   Acute bacterial bronchitis 07/13/2014   Abdominal discomfort 02/23/2014   Colitis 02/23/2014   Overweight (BMI 25.0-29.9) 02/01/2013   Joint pain 11/18/2011   Depression with anxiety 10/02/2009   SHOULDER PAIN, RIGHT 10/02/2009   PALPITATIONS, RECURRENT 10/02/2009   ANEMIA-NOS 08/15/2007   ALLERGIC RHINITIS 08/15/2007   REACTIVE AIRWAY DISEASE 08/15/2007   SHORTNESS OF BREATH 08/15/2007    Allergies: No  Known Allergies Medications:  Current Outpatient Medications:    amphetamine-dextroamphetamine (ADDERALL) 10 MG tablet, Take 1 tablet (10 mg total) by mouth 2 (two) times daily., Disp: 60 tablet, Rfl: 0   buPROPion (WELLBUTRIN XL) 150 MG 24 hr tablet, Take 1 tablet (150 mg total) by mouth daily., Disp: 90 tablet, Rfl: 1   citalopram (CELEXA) 10 MG tablet, Take 1 tablet (10 mg total) by mouth daily. Pt needs OV for further refills, Disp: 90 tablet, Rfl: 3   Levonorgestrel-Ethinyl Estrad (LARISSIA PO), Take 1  tablet by mouth daily., Disp: , Rfl:   Observations/Objective: Patient is well-developed, well-nourished in no acute distress.  Resting comfortably  at home.  Head is normocephalic, atraumatic.  No labored breathing.  Speech is clear and coherent with logical content.  Patient is alert and oriented at baseline.    Assessment and Plan:  1. Viral URI with cough COVID neg HT  Recent illness going around work- viral related most likely OTC measures as below ordered and reviewed   Reviewed side effects, risks and benefits of medication.     - ipratropium (ATROVENT) 0.03 % nasal spray; Place 2 sprays into both nostrils every 12 (twelve) hours.  Dispense: 30 mL; Refill: 0 - benzonatate (TESSALON) 100 MG capsule; Take 1 capsule (100 mg total) by mouth 2 (two) times daily as needed for cough.  Dispense: 20 capsule; Refill: 0  Patient acknowledged agreement and understanding of the plan.   I discussed the assessment and treatment plan with the patient. The patient was provided an opportunity to ask questions and all were answered. The patient agreed with the plan and demonstrated an understanding of the instructions.   The patient was advised to call back or seek an in-person evaluation if the symptoms worsen or if the condition fails to improve as anticipated.   The above assessment and management plan was discussed with the patient. The patient verbalized understanding of and has agreed to the management plan. Patient is aware to call the clinic if symptoms persist or worsen. Patient is aware when to return to the clinic for a follow-up visit. Patient educated on when it is appropriate to go to the emergency department.    Follow Up Instructions: I discussed the assessment and treatment plan with the patient. The patient was provided an opportunity to ask questions and all were answered. The patient agreed with the plan and demonstrated an understanding of the instructions.  A copy of  instructions were sent to the patient via MyChart unless otherwise noted below.    The patient was advised to call back or seek an in-person evaluation if the symptoms worsen or if the condition fails to improve as anticipated.  Time:  I spent 10 minutes with the patient via telehealth technology discussing the above problems/concerns.    Perlie Mayo, NP

## 2021-02-26 ENCOUNTER — Other Ambulatory Visit: Payer: Self-pay | Admitting: Family Medicine

## 2021-02-26 DIAGNOSIS — F418 Other specified anxiety disorders: Secondary | ICD-10-CM

## 2021-02-28 ENCOUNTER — Other Ambulatory Visit: Payer: Self-pay | Admitting: Family Medicine

## 2021-02-28 DIAGNOSIS — Z1231 Encounter for screening mammogram for malignant neoplasm of breast: Secondary | ICD-10-CM

## 2021-03-07 ENCOUNTER — Ambulatory Visit
Admission: RE | Admit: 2021-03-07 | Discharge: 2021-03-07 | Disposition: A | Discharge status: Home / Self Care | Payer: Commercial Managed Care - PPO | Source: Ambulatory Visit | Attending: Family Medicine | Admitting: Family Medicine

## 2021-03-07 DIAGNOSIS — Z1231 Encounter for screening mammogram for malignant neoplasm of breast: Secondary | ICD-10-CM

## 2021-07-04 ENCOUNTER — Encounter: Payer: Self-pay | Admitting: Family Medicine

## 2021-07-18 ENCOUNTER — Ambulatory Visit (INDEPENDENT_AMBULATORY_CARE_PROVIDER_SITE_OTHER): Payer: Commercial Managed Care - PPO | Admitting: Family Medicine

## 2021-07-18 ENCOUNTER — Encounter: Payer: Self-pay | Admitting: Family Medicine

## 2021-07-18 VITALS — BP 100/68 | HR 78 | Temp 98.3°F | Resp 18 | Ht 66.0 in | Wt 173.4 lb

## 2021-07-18 DIAGNOSIS — F419 Anxiety disorder, unspecified: Secondary | ICD-10-CM | POA: Diagnosis not present

## 2021-07-18 DIAGNOSIS — F418 Other specified anxiety disorders: Secondary | ICD-10-CM

## 2021-07-18 DIAGNOSIS — F900 Attention-deficit hyperactivity disorder, predominantly inattentive type: Secondary | ICD-10-CM

## 2021-07-18 DIAGNOSIS — F988 Other specified behavioral and emotional disorders with onset usually occurring in childhood and adolescence: Secondary | ICD-10-CM | POA: Diagnosis not present

## 2021-07-18 MED ORDER — AMPHETAMINE-DEXTROAMPHETAMINE 10 MG PO TABS: 10.0000 mg | ORAL_TABLET | Freq: Every day | ORAL | 0 refills | Status: AC

## 2021-07-18 MED ORDER — ALPRAZOLAM 0.25 MG PO TABS: 0.2500 mg | ORAL_TABLET | Freq: Two times a day (BID) | ORAL | 0 refills | Status: AC | PRN

## 2021-07-18 NOTE — Progress Notes (Signed)
Established Patient Office Visit  Subjective   Patient ID: Angela Harrington, female    DOB: July 23, 1970  Age: 51 y.o. MRN: [id number]  Chief Complaint  Patient presents with   Referral    Pt here to discuss a referral for treatment for ADHD    HPI Pt is here to get a referral for her add.  She has been seeing a psych on line and is being treated for anxiety with xanax--- and con't her wellbutrin and celexa and the psych is giving her adderall for add.   Pt is having no other problems  Patient Active Problem List   Diagnosis Date Noted   Preventative health care 09/06/2020   Attention deficit hyperactivity disorder (ADHD), predominantly inattentive type 09/06/2020   Acute bacterial bronchitis 07/13/2014   Abdominal discomfort 02/23/2014   Colitis 02/23/2014   Overweight (BMI 25.0-29.9) 02/01/2013   Joint pain 11/18/2011   Depression with anxiety 10/02/2009   SHOULDER PAIN, RIGHT 10/02/2009   PALPITATIONS, RECURRENT 10/02/2009   ANEMIA-NOS 08/15/2007   ALLERGIC RHINITIS 08/15/2007   REACTIVE AIRWAY DISEASE 08/15/2007   SHORTNESS OF BREATH 08/15/2007   Past Medical History:  Diagnosis Date   Allergy    rhinitis   Anemia as a child   Endometriosis 1995   Past Surgical History:  Procedure Laterality Date   laporoscopy for endometriosis  1995   TONSILLECTOMY  1979   Social History   Tobacco Use   Smoking status: Never   Smokeless tobacco: Never  Substance Use Topics   Alcohol use: Yes    Comment: Socially   Drug use: No   Social History   Socioeconomic History   Marital status: Married    Spouse name: Not on file   Number of children: 0   Years of education: Not on file   Highest education level: Not on file  Occupational History   Occupation: Haematologist: [employer]  Tobacco Use   Smoking status: Never   Smokeless tobacco: Never  Substance and Sexual Activity   Alcohol use: Yes    Comment: Socially   Drug use: No   Sexual activity: Not on  file  Other Topics Concern   Not on file  Social History Narrative   Regular exercise- no   Social Determinants of Health   Financial Resource Strain: Not on file  Food Insecurity: Not on file  Transportation Needs: Not on file  Physical Activity: Not on file  Stress: Not on file  Social Connections: Not on file  Intimate Partner Violence: Not on file   Family Status  Relation Name Status   Other 1st degree relative Alive   Mother  (Not Specified)   Father  (Not Specified)   Sister  (Not Specified)   Family History  Problem Relation Age of Onset   Cancer Other        breast   Arthritis Mother        osteoarthritis   Hyperlipidemia Mother    Alcohol abuse Father    Arthritis Father        rheumatoid   Hypertension Father    Breast cancer Sister    No Known Allergies    ROS    Objective:     BP 100/68 (BP Location: Left Arm, Patient Position: Sitting, Cuff Size: Normal)   Pulse 78   Temp 98.3 F (36.8 C) (Oral)   Resp 18   Ht '5\' 6"'$  (1.676 m)   Wt  173 lb 6.4 oz (78.7 kg)   SpO2 97%   BMI 27.99 kg/m  BP Readings from Last 3 Encounters:  07/18/21 100/68  09/06/20 108/70  07/05/18 124/67   Wt Readings from Last 3 Encounters:  07/18/21 173 lb 6.4 oz (78.7 kg)  09/06/20 161 lb 3.2 oz (73.1 kg)  07/05/18 163 lb 9.6 oz (74.2 kg)   SpO2 Readings from Last 3 Encounters:  07/18/21 97%  09/06/20 98%  07/05/18 100%    Physical Exam   No results found for any visits on 07/18/21.  Last CBC Lab Results  Component Value Date   WBC 7.0 09/06/2020   HGB 13.4 09/06/2020   HCT 39.7 09/06/2020   MCV 90.1 09/06/2020   MCH 29.8 09/30/2014   RDW 13.5 09/06/2020   PLT 264.0 03/19/3141   Last metabolic panel Lab Results  Component Value Date   GLUCOSE 86 09/06/2020   NA 138 09/06/2020   K 3.9 09/06/2020   CL 103 09/06/2020   CO2 26 09/06/2020   BUN 12 09/06/2020   CREATININE 0.93 09/06/2020   GFRNONAA >60 09/30/2014   CALCIUM 9.5 09/06/2020   PROT  7.2 09/06/2020   ALBUMIN 4.5 09/06/2020   BILITOT 0.9 09/06/2020   ALKPHOS 54 09/06/2020   AST 14 09/06/2020   ALT 21 09/06/2020   ANIONGAP 9 09/30/2014   Last lipids Lab Results  Component Value Date   CHOL 223 (H) 09/06/2020   HDL 68.20 09/06/2020   LDLCALC 144 (H) 09/06/2020   LDLDIRECT 141.7 11/18/2011   TRIG 53.0 09/06/2020   CHOLHDL 3 09/06/2020   Last hemoglobin A1c Lab Results  Component Value Date   HGBA1C 5.7 09/06/2020   Last thyroid functions Lab Results  Component Value Date   TSH 2.28 09/06/2020   Last vitamin D No results found for: "25OHVITD2", "25OHVITD3", "VD25OH" Last vitamin B12 and Folate Lab Results  Component Value Date   VITAMINB12 347 10/02/2009   FOLATE 6.7 10/02/2009      The 10-year ASCVD risk score (Arnett DK, et al., 2019) is: 0.6%    Assessment & Plan:   Problem List Items Addressed This Visit       Unprioritized   Depression with anxiety    con't celexa and wellbutrin  con't with online psych and xanax per them Referral form filled out       Relevant Medications   ALPRAZolam (XANAX) 0.25 MG tablet   Attention deficit hyperactivity disorder (ADHD), predominantly inattentive type    Per psych On adderall       Other Visit Diagnoses     Attention deficit disorder (ADD) without hyperactivity    -  Primary   Relevant Medications   amphetamine-dextroamphetamine (ADDERALL) 10 MG tablet   Anxiety       Relevant Medications   ALPRAZolam (XANAX) 0.25 MG tablet       Return if symptoms worsen or fail to improve, for annual exam, fasting.    Ann Held, DO

## 2021-07-18 NOTE — Assessment & Plan Note (Signed)
con't celexa and wellbutrin  con't with online psych and xanax per them Referral form filled out

## 2021-07-18 NOTE — Assessment & Plan Note (Signed)
Per psych On adderall

## 2021-07-18 NOTE — Patient Instructions (Signed)
Living With Attention Deficit Hyperactivity Disorder If you have been diagnosed with attention deficit hyperactivity disorder (ADHD), you may be relieved that you now know why you have felt or behaved a certain way. Still, you may feel overwhelmed about the treatment ahead. You may also wonder how to get the support you need and how to deal with the condition day-to-day. With treatment and support, you can live with ADHD and manage your symptoms. How to manage lifestyle changes Managing stress Stress is your body's reaction to life changes and events, both good and bad. To cope with the stress of an ADHD diagnosis, it may help to: Learn more about ADHD. Exercise regularly. Even a short daily walk can lower stress levels. Participate in training or education programs (including social skills training classes) that teach you to deal with symptoms.  Medicines Your health care provider may suggest certain medicines if he or she feels that they will help to improve your condition. Stimulant medicines are usually prescribed to treat ADHD, and therapy may also be prescribed. It is important to: Avoid using alcohol and other substances that may prevent your medicines from working properly (may interact). Talk with your pharmacist or health care provider about all the medicines that you take, their possible side effects, and what medicines are safe to take together. Make it your goal to take part in all treatment decisions (shared decision-making). Ask about possible side effects of medicines that your health care provider recommends, and tell him or her how you feel about having those side effects. It is best if shared decision-making with your health care provider is part of your total treatment plan. Relationships To strengthen your relationships with family members while treating your condition, consider taking part in family therapy. You might also attend self-help groups alone or with a loved one. Be  honest about how your symptoms affect your relationships. Make an effort to communicate respectfully instead of fighting, and find ways to show others that you care. Psychotherapy may be useful in helping you cope with how ADHD affects your relationships. How to recognize changes in your condition The following signs may mean that your treatment is working well and your condition is improving: Consistently being on time for appointments. Being more organized at home and work. Other people noticing improvements in your behavior. Achieving goals that you set for yourself. Thinking more clearly. The following signs may mean that your treatment is not working very well: Feeling impatience or more confusion. Missing, forgetting, or being late for appointments. An increasing sense of disorganization and messiness. More difficulty in reaching goals that you set for yourself. Loved ones becoming angry or frustrated with you. Follow these instructions at home: Take over-the-counter and prescription medicines only as told by your health care provider. Check with your health care provider before taking any new medicines. Create structure and an organized atmosphere at home. For example: Make a list of tasks, then rank them from most important to least important. Work on one task at a time until your listed tasks are done. Make a daily schedule and follow it consistently every day. Use an appointment calendar, and check it 2 or 3 times a day to keep on track. Keep it with you when you leave the house. Create spaces where you keep certain things, and always put things back in their places after you use them. Keep all follow-up visits as told by your health care provider. This is important. Where to find support Talking to others    Keep emotion out of important discussions and speak in a calm, logical way. Listen closely and patiently to your loved ones. Try to understand their point of view, and try to  avoid getting defensive. Take responsibility for the consequences of your actions. Ask that others do not take your behaviors personally. Aim to solve problems as they come up, and express your feelings instead of bottling them up. Talk openly about what you need from your loved ones and how they can support you. Consider going to family therapy sessions or having your family meet with a specialist who deals with ADHD-related behavior problems. Finances Not all insurance plans cover mental health care, so it is important to check with your insurance carrier. If paying for co-pays or counseling services is a problem, search for a local or county mental health care center. Public mental health care services may be offered there at a low cost or no cost when you are not able to see a private health care provider. If you are taking medicine for ADHD, you may be able to get the generic form, which may be less expensive than brand-name medicine. Some makers of prescription medicines also offer help to patients who cannot afford the medicines that they need. Questions to ask your health care provider: What are the risks and benefits of taking medicines? Would I benefit from therapy? How often should I follow up with a health care provider? Contact a health care provider if: You have side effects from your medicines, such as: Repeated muscle twitches, coughing, or speech outbursts. Sleep problems. Loss of appetite. Depression. New or worsening behavior problems. Dizziness. Unusually fast heartbeat. Stomach pains. Headaches. Get help right away if: You have a severe reaction to a medicine. Your behavior suddenly gets worse. Summary With treatment and support, you can live with ADHD and manage your symptoms. The medicines that are most often prescribed for ADHD are stimulants. Consider taking part in family therapy or self-help groups with family members or friends. When you talk with friends  and family about your ADHD, be patient and communicate openly. Take over-the-counter and prescription medicines only as told by your health care provider. Check with your health care provider before taking any new medicines. This information is not intended to replace advice given to you by your health care provider. Make sure you discuss any questions you have with your health care provider. Document Revised: 05/19/2019 Document Reviewed: 06/21/2019 Elsevier Patient Education  [place].

## 2021-07-23 ENCOUNTER — Other Ambulatory Visit: Payer: Self-pay | Admitting: Family Medicine

## 2021-07-23 DIAGNOSIS — F32A Depression, unspecified: Secondary | ICD-10-CM

## 2021-09-11 ENCOUNTER — Ambulatory Visit (INDEPENDENT_AMBULATORY_CARE_PROVIDER_SITE_OTHER): Payer: Commercial Managed Care - PPO | Admitting: Family Medicine

## 2021-09-11 ENCOUNTER — Encounter: Payer: Self-pay | Admitting: Family Medicine

## 2021-09-11 VITALS — BP 106/70 | HR 74 | Temp 98.9°F | Resp 18 | Ht 66.0 in | Wt 176.4 lb

## 2021-09-11 DIAGNOSIS — Z0000 Encounter for general adult medical examination without abnormal findings: Secondary | ICD-10-CM

## 2021-09-11 NOTE — Assessment & Plan Note (Signed)
ghm utd Check labs  See avs  

## 2021-09-11 NOTE — Patient Instructions (Signed)
Preventive Care 40-49 Years Old, Female Preventive care refers to lifestyle choices and visits with your health care provider that can promote health and wellness. Preventive care visits are also called wellness exams. What can I expect for my preventive care visit? Counseling Your health care provider may ask you questions about your: Medical history, including: Past medical problems. Family medical history. Pregnancy history. Current health, including: Menstrual cycle. Method of birth control. Emotional well-being. Home life and relationship well-being. Sexual activity and sexual health. Lifestyle, including: Alcohol, nicotine or tobacco, and drug use. Access to firearms. Diet, exercise, and sleep habits. Work and work environment. Sunscreen use. Safety issues such as seatbelt and bike helmet use. Physical exam Your health care provider will check your: Height and weight. These may be used to calculate your BMI (body mass index). BMI is a measurement that tells if you are at a healthy weight. Waist circumference. This measures the distance around your waistline. This measurement also tells if you are at a healthy weight and may help predict your risk of certain diseases, such as type 2 diabetes and high blood pressure. Heart rate and blood pressure. Body temperature. Skin for abnormal spots. What immunizations do I need?  Vaccines are usually given at various ages, according to a schedule. Your health care provider will recommend vaccines for you based on your age, medical history, and lifestyle or other factors, such as travel or where you work. What tests do I need? Screening Your health care provider may recommend screening tests for certain conditions. This may include: Lipid and cholesterol levels. Diabetes screening. This is done by checking your blood sugar (glucose) after you have not eaten for a while (fasting). Pelvic exam and Pap test. Hepatitis B test. Hepatitis C  test. HIV (human immunodeficiency virus) test. STI (sexually transmitted infection) testing, if you are at risk. Lung cancer screening. Colorectal cancer screening. Mammogram. Talk with your health care provider about when you should start having regular mammograms. This may depend on whether you have a family history of breast cancer. BRCA-related cancer screening. This may be done if you have a family history of breast, ovarian, tubal, or peritoneal cancers. Bone density scan. This is done to screen for osteoporosis. Talk with your health care provider about your test results, treatment options, and if necessary, the need for more tests. Follow these instructions at home: Eating and drinking  Eat a diet that includes fresh fruits and vegetables, whole grains, lean protein, and low-fat dairy products. Take vitamin and mineral supplements as recommended by your health care provider. Do not drink alcohol if: Your health care provider tells you not to drink. You are pregnant, may be pregnant, or are planning to become pregnant. If you drink alcohol: Limit how much you have to 0-1 drink a day. Know how much alcohol is in your drink. In the [place] one drink equals one 12 oz bottle of beer (355 mL), one 5 oz glass of wine (148 mL), or one 1 oz glass of hard liquor (44 mL). Lifestyle Brush your teeth every morning and night with fluoride toothpaste. Floss one time each day. Exercise for at least 30 minutes 5 or more days each week. Do not use any products that contain nicotine or tobacco. These products include cigarettes, chewing tobacco, and vaping devices, such as e-cigarettes. If you need help quitting, ask your health care provider. Do not use drugs. If you are sexually active, practice safe sex. Use a condom or other form of protection to   prevent STIs. If you do not wish to become pregnant, use a form of birth control. If you plan to become pregnant, see your health care provider for a  prepregnancy visit. Take aspirin only as told by your health care provider. Make sure that you understand how much to take and what form to take. Work with your health care provider to find out whether it is safe and beneficial for you to take aspirin daily. Find healthy ways to manage stress, such as: Meditation, yoga, or listening to music. Journaling. Talking to a trusted person. Spending time with friends and family. Minimize exposure to UV radiation to reduce your risk of skin cancer. Safety Always wear your seat belt while driving or riding in a vehicle. Do not drive: If you have been drinking alcohol. Do not ride with someone who has been drinking. When you are tired or distracted. While texting. If you have been using any mind-altering substances or drugs. Wear a helmet and other protective equipment during sports activities. If you have firearms in your house, make sure you follow all gun safety procedures. Seek help if you have been physically or sexually abused. What's next? Visit your health care provider once a year for an annual wellness visit. Ask your health care provider how often you should have your eyes and teeth checked. Stay up to date on all vaccines. This information is not intended to replace advice given to you by your health care provider. Make sure you discuss any questions you have with your health care provider. Document Revised: 07/03/2020 Document Reviewed: 07/03/2020 Elsevier Patient Education  [place].

## 2021-09-11 NOTE — Progress Notes (Signed)
Subjective:     Angela Harrington is a 51 y.o. female and is here for a comprehensive physical exam. The patient reports no problems.  Social History   Socioeconomic History   Marital status: Married    Spouse name: Not on file   Number of children: 0   Years of education: Not on file   Highest education level: Not on file  Occupational History   Occupation: attorney    Comment: [employer]  Tobacco Use   Smoking status: Never   Smokeless tobacco: Never  Substance and Sexual Activity   Alcohol use: Yes    Comment: Socially   Drug use: No   Sexual activity: Yes    Partners: Male  Other Topics Concern   Not on file  Social History Narrative   Regular exercise- no   Social Determinants of Health   Financial Resource Strain: Not on file  Food Insecurity: Not on file  Transportation Needs: Not on file  Physical Activity: Not on file  Stress: Not on file  Social Connections: Not on file  Intimate Partner Violence: Not on file   Health Maintenance  Topic Date Due   INFLUENZA VACCINE  08/19/2021   HIV Screening  02/20/2023 (Originally 05/18/1987)   MAMMOGRAM  03/07/2022   COLONOSCOPY (Pts 45-68yr Insurance coverage will need to be confirmed)  06/10/2023   PAP SMEAR-Modifier  01/23/2024   TETANUS/TDAP  09/29/2024   COVID-19 Vaccine  Completed   Hepatitis C Screening  Completed   HPV VACCINES  Aged Out    The following portions of the patient's history were reviewed and updated as appropriate: She  has a past medical history of Allergy, Anemia (as a child), and Endometriosis (1995). She does not have any pertinent problems on file. She  has a past surgical history that includes laporoscopy for endometriosis (1995) and Tonsillectomy (1979). Her family history includes Alcohol abuse in her father; Arthritis in her father and mother; Breast cancer in her sister; Cancer in an other family member; Hyperlipidemia in her mother; Hypertension in her father. She  reports that she  has never smoked. She has never used smokeless tobacco. She reports current alcohol use. She reports that she does not use drugs. She has a current medication list which includes the following prescription(s): alprazolam, amphetamine-dextroamphetamine, bupropion, citalopram, and levonorgestrel-ethinyl estrad. Current Outpatient Medications on File Prior to Visit  Medication Sig Dispense Refill   ALPRAZolam (XANAX) 0.25 MG tablet Take 1 tablet (0.25 mg total) by mouth 2 (two) times daily as needed for anxiety. 20 tablet 0   amphetamine-dextroamphetamine (ADDERALL) 10 MG tablet Take 1 tablet (10 mg total) by mouth daily with breakfast. 60 tablet 0   buPROPion (WELLBUTRIN XL) 150 MG 24 hr tablet TAKE 1 TABLET BY MOUTH EVERY DAY 90 tablet 1   citalopram (CELEXA) 10 MG tablet Take 1 tablet (10 mg total) by mouth daily. 90 tablet 1   Levonorgestrel-Ethinyl Estrad (LARISSIA PO) Take 1 tablet by mouth daily.     No current facility-administered medications on file prior to visit.   She has No Known Allergies..  Review of Systems Review of Systems  Constitutional: Negative for activity change, appetite change and fatigue.  HENT: Negative for hearing loss, congestion, tinnitus and ear discharge.  dentist [id number]: Negative for visual disturbance (see optho q1y -- vision corrected to 20/20 with glasses).  Respiratory: Negative for cough, chest tightness and shortness of breath.   Cardiovascular: Negative for chest pain, palpitations and leg  swelling.  Gastrointestinal: Negative for abdominal pain, diarrhea, constipation and abdominal distention.  Genitourinary: Negative for urgency, frequency, decreased urine volume and difficulty urinating.  Musculoskeletal: Negative for back pain, arthralgias and gait problem.  Skin: Negative for color change, pallor and rash.  Neurological: Negative for dizziness, light-headedness, numbness and headaches.  Hematological: Negative for adenopathy. Does not  bruise/bleed easily.  Psychiatric/Behavioral: Negative for suicidal ideas, confusion, sleep disturbance, self-injury, dysphoric mood, decreased concentration and agitation.      Objective:    BP 106/70 (BP Location: Left Arm, Patient Position: Sitting, Cuff Size: Normal)   Pulse 74   Temp 98.9 F (37.2 C) (Oral)   Resp 18   Ht '5\' 6"'$  (1.676 m)   Wt 176 lb 6.4 oz (80 kg)   SpO2 97%   BMI 28.47 kg/m  General appearance: alert, cooperative, and appears stated age Head: Normocephalic, without obvious abnormality, atraumatic Eyes: conjunctivae/corneas clear. PERRL, EOM's intact. Fundi benign. Ears: normal TM's and external ear canals both ears Nose: Nares normal. Septum midline. Mucosa normal. No drainage or sinus tenderness. Throat: lips, mucosa, and tongue normal; teeth and gums normal Neck: no adenopathy, no carotid bruit, no JVD, supple, symmetrical, trachea midline, and thyroid not enlarged, symmetric, no tenderness/mass/nodules Back: symmetric, no curvature. ROM normal. No CVA tenderness. Lungs: clear to auscultation bilaterally Heart: regular rate and rhythm, S1, S2 normal, no murmur, click, rub or gallop Abdomen: soft, non-tender; bowel sounds normal; no masses,  no organomegaly Extremities: extremities normal, atraumatic, no cyanosis or edema Pulses: 2+ and symmetric Skin: Skin color, texture, turgor normal. No rashes or lesions Lymph nodes: Cervical, supraclavicular, and axillary nodes normal. Neurologic: Alert and oriented X 3, normal strength and tone. Normal symmetric reflexes. Normal coordination and gait    Assessment:    Healthy female exam.      Plan:  Ghm utd Check labs    See After Visit Summary for Counseling Recommendations   1. Preventative health care See above - CBC with Differential/Platelet - Comprehensive metabolic panel - Hemoglobin A1c - Lipid panel - TSH

## 2021-09-12 LAB — CBC WITH DIFFERENTIAL/PLATELET
Basophils Absolute: 0 10*3/uL (ref 0.0–0.1)
Basophils Relative: 0.4 % (ref 0.0–3.0)
Eosinophils Absolute: 0.1 10*3/uL (ref 0.0–0.7)
Eosinophils Relative: 1.2 % (ref 0.0–5.0)
HCT: 38.6 % (ref 36.0–46.0)
Hemoglobin: 12.9 g/dL (ref 12.0–15.0)
Lymphocytes Relative: 30.1 % (ref 12.0–46.0)
Lymphs Abs: 2.1 10*3/uL (ref 0.7–4.0)
MCHC: 33.5 g/dL (ref 30.0–36.0)
MCV: 91.1 fl (ref 78.0–100.0)
Monocytes Absolute: 0.5 10*3/uL (ref 0.1–1.0)
Monocytes Relative: 6.7 % (ref 3.0–12.0)
Neutro Abs: 4.2 10*3/uL (ref 1.4–7.7)
Neutrophils Relative %: 61.6 % (ref 43.0–77.0)
Platelets: 249 10*3/uL (ref 150.0–400.0)
RBC: 4.23 Mil/uL (ref 3.87–5.11)
RDW: 13.5 % (ref 11.5–15.5)
WBC: 6.9 10*3/uL (ref 4.0–10.5)

## 2021-09-12 LAB — COMPREHENSIVE METABOLIC PANEL
ALT: 8 U/L (ref 0–35)
AST: 11 U/L (ref 0–37)
Albumin: 4.2 g/dL (ref 3.5–5.2)
Alkaline Phosphatase: 48 U/L (ref 39–117)
BUN: 13 mg/dL (ref 6–23)
CO2: 27 mEq/L (ref 19–32)
Calcium: 8.6 mg/dL (ref 8.4–10.5)
Chloride: 103 mEq/L (ref 96–112)
Creatinine, Ser: 0.89 mg/dL (ref 0.40–1.20)
GFR: 76.18 mL/min (ref >60.00)
Glucose, Bld: 88 mg/dL (ref 70–99)
Potassium: 3.8 mEq/L (ref 3.5–5.1)
Sodium: 136 mEq/L (ref 135–145)
Total Bilirubin: 0.4 mg/dL (ref 0.2–1.2)
Total Protein: 6.6 g/dL (ref 6.0–8.3)

## 2021-09-12 LAB — LIPID PANEL
Cholesterol: 206 mg/dL — ABNORMAL HIGH (ref 0–200)
HDL: 69.7 mg/dL (ref >39.00)
LDL Cholesterol: 116 mg/dL — ABNORMAL HIGH (ref 0–99)
NonHDL: 136.27
Total CHOL/HDL Ratio: 3
Triglycerides: 102 mg/dL (ref 0.0–149.0)
VLDL: 20.4 mg/dL (ref 0.0–40.0)

## 2021-09-12 LAB — HEMOGLOBIN A1C: Hgb A1c MFr Bld: 5.7 % (ref 4.6–6.5)

## 2021-09-12 LAB — TSH: TSH: 1.46 u[IU]/mL (ref 0.35–5.50)

## 2021-09-16 ENCOUNTER — Telehealth: Payer: Self-pay

## 2021-09-16 NOTE — Telephone Encounter (Signed)
Wellness form faxed and placed in scan.

## 2021-10-29 ENCOUNTER — Other Ambulatory Visit: Payer: Self-pay | Admitting: Family Medicine

## 2021-10-29 DIAGNOSIS — F418 Other specified anxiety disorders: Secondary | ICD-10-CM

## 2022-01-09 ENCOUNTER — Ambulatory Visit
Admission: EM | Admit: 2022-01-09 | Discharge: 2022-01-09 | Disposition: A | Discharge status: Home / Self Care | Payer: Commercial Managed Care - PPO | Attending: Urgent Care | Admitting: Urgent Care

## 2022-01-09 DIAGNOSIS — J4520 Mild intermittent asthma, uncomplicated: Secondary | ICD-10-CM | POA: Diagnosis not present

## 2022-01-09 DIAGNOSIS — J019 Acute sinusitis, unspecified: Secondary | ICD-10-CM | POA: Diagnosis not present

## 2022-01-09 MED ORDER — PREDNISONE 20 MG PO TABS: ORAL_TABLET | ORAL | 0 refills | Status: DC

## 2022-01-09 MED ORDER — PROMETHAZINE-DM 6.25-15 MG/5ML PO SYRP: 5.0000 mL | ORAL_SOLUTION | Freq: Every evening | ORAL | 0 refills | Status: DC | PRN

## 2022-01-09 MED ORDER — CETIRIZINE HCL 10 MG PO TABS: 10.0000 mg | ORAL_TABLET | Freq: Every day | ORAL | 0 refills | Status: DC

## 2022-01-09 MED ORDER — AMOXICILLIN 875 MG PO TABS: 875.0000 mg | ORAL_TABLET | Freq: Two times a day (BID) | ORAL | 0 refills | Status: DC

## 2022-01-09 MED ORDER — BENZONATATE 100 MG PO CAPS
100.0000 mg | ORAL_CAPSULE | Freq: Three times a day (TID) | ORAL | 0 refills | Status: DC | PRN
Start: 2022-01-09 — End: 2022-11-17

## 2022-01-09 NOTE — ED Provider Notes (Signed)
[facility]  Note:  This document was prepared using Systems analyst and may include unintentional dictation errors.  MRN: [id number] DOB: 10-Oct-1970  Subjective:   Angela Harrington is a 51 y.o. female presenting for 8-9 day history of acute onset persistent sinus congestion, sinus drainage, persistent coughing, chest tightness and wheezing.  Has used multiple over-the-counter measures with some relief.  No smoking, vaping or marijuana use.  Has a history of exercise-induced asthma, reactive airway disease.  Also has allergic rhinitis.  No current facility-administered medications for this encounter.  Current Outpatient Medications:    ALPRAZolam (XANAX) 0.25 MG tablet, Take 1 tablet (0.25 mg total) by mouth 2 (two) times daily as needed for anxiety., Disp: 20 tablet, Rfl: 0   amphetamine-dextroamphetamine (ADDERALL) 10 MG tablet, Take 1 tablet (10 mg total) by mouth daily with breakfast., Disp: 60 tablet, Rfl: 0   buPROPion (WELLBUTRIN XL) 150 MG 24 hr tablet, TAKE 1 TABLET BY MOUTH EVERY DAY, Disp: 90 tablet, Rfl: 1   citalopram (CELEXA) 10 MG tablet, Take 1 tablet (10 mg total) by mouth daily., Disp: 90 tablet, Rfl: 1   Levonorgestrel-Ethinyl Estrad (LARISSIA PO), Take 1 tablet by mouth daily., Disp: , Rfl:    No Known Allergies  Past Medical History:  Diagnosis Date   Allergy    rhinitis   Anemia as a child   Endometriosis 1995     Past Surgical History:  Procedure Laterality Date   laporoscopy for endometriosis  1995   TONSILLECTOMY  1979    Family History  Problem Relation Age of Onset   Arthritis Mother        osteoarthritis   Hyperlipidemia Mother    Alcohol abuse Father    Arthritis Father        rheumatoid   Hypertension Father    Breast cancer Sister    Cancer Other        breast    Social History   Tobacco Use   Smoking status: Never   Smokeless tobacco: Never  Substance Use Topics   Alcohol use: Yes     Comment: Socially   Drug use: No    ROS   Objective:   Vitals: BP 118/76   Pulse 82   Temp 98.7 F (37.1 C)   Resp 17   LMP  (LMP Unknown)   SpO2 94%   Physical Exam Constitutional:      General: She is not in acute distress.    Appearance: Normal appearance. She is well-developed and normal weight. She is not ill-appearing, toxic-appearing or diaphoretic.  HENT:     Head: Normocephalic and atraumatic.     Right Ear: Tympanic membrane, ear canal and external ear normal. No drainage or tenderness. No middle ear effusion. There is no impacted cerumen. Tympanic membrane is not erythematous or bulging.     Left Ear: Tympanic membrane, ear canal and external ear normal. No drainage or tenderness.  No middle ear effusion. There is no impacted cerumen. Tympanic membrane is not erythematous or bulging.     Nose: Congestion present. No rhinorrhea.     Mouth/Throat:     Mouth: Mucous membranes are moist. No oral lesions.     Pharynx: No pharyngeal swelling, oropharyngeal exudate, posterior oropharyngeal erythema or uvula swelling.     Tonsils: No tonsillar exudate or tonsillar abscesses.     Comments: Postnasal drainage overlying pharynx. Eyes:     General: No scleral icterus.  Right eye: No discharge.        Left eye: No discharge.     Extraocular Movements: Extraocular movements intact.     Right eye: Normal extraocular motion.     Left eye: Normal extraocular motion.     Conjunctiva/sclera: Conjunctivae normal.  Cardiovascular:     Rate and Rhythm: Normal rate and regular rhythm.     Heart sounds: Normal heart sounds. No murmur heard.    No friction rub. No gallop.  Pulmonary:     Effort: Pulmonary effort is normal. No respiratory distress.     Breath sounds: No stridor. No wheezing, rhonchi or rales.  Chest:     Chest wall: No tenderness.  Musculoskeletal:     Cervical back: Normal range of motion and neck supple.  Lymphadenopathy:     Cervical: No cervical  adenopathy.  Skin:    General: Skin is warm and dry.  Neurological:     General: No focal deficit present.     Mental Status: She is alert and oriented to person, place, and time.  Psychiatric:        Mood and Affect: Mood normal.        Behavior: Behavior normal.       Assessment and Plan :   PDMP not reviewed this encounter.  1. Acute sinusitis, recurrence not specified, unspecified location   2. Mild intermittent reactive airway disease without complication     Deferred imaging given clear cardiopulmonary exam, hemodynamically stable vital signs.  In the context of her reactive airway disease, respiratory symptoms recommended an oral prednisone course.  Will start empiric treatment for sinusitis with amoxicillin.  Recommended supportive care otherwise including the use of oral antihistamine, decongestant. Counseled patient on potential for adverse effects with medications prescribed/recommended today, ER and return-to-clinic precautions discussed, patient verbalized understanding.    Jaynee Eagles, PA-C 01/09/22 1131

## 2022-01-09 NOTE — ED Triage Notes (Signed)
Pt presents with ongoing cough for a little over a week. Reports sinus congestion and feeling fatigued.

## 2022-04-11 ENCOUNTER — Other Ambulatory Visit: Payer: Self-pay | Admitting: Family Medicine

## 2022-04-11 DIAGNOSIS — F32A Depression, unspecified: Secondary | ICD-10-CM

## 2022-04-28 ENCOUNTER — Other Ambulatory Visit: Payer: Self-pay | Admitting: Family Medicine

## 2022-04-28 DIAGNOSIS — Z1231 Encounter for screening mammogram for malignant neoplasm of breast: Secondary | ICD-10-CM

## 2022-05-03 ENCOUNTER — Other Ambulatory Visit: Payer: Self-pay | Admitting: Family Medicine

## 2022-05-03 DIAGNOSIS — F418 Other specified anxiety disorders: Secondary | ICD-10-CM

## 2022-05-05 ENCOUNTER — Ambulatory Visit
Admission: RE | Admit: 2022-05-05 | Discharge: 2022-05-05 | Disposition: A | Discharge status: Home / Self Care | Payer: Commercial Managed Care - PPO | Source: Ambulatory Visit

## 2022-05-05 DIAGNOSIS — Z1231 Encounter for screening mammogram for malignant neoplasm of breast: Secondary | ICD-10-CM

## 2022-06-10 IMAGING — MG DIGITAL SCREENING BILAT W/ TOMO W/ CAD
6 of 12 series · 6 of 36 positions shown · non-contrast
Comparison: Previous exam(s).

CLINICAL DATA: Screening.

EXAM:
DIGITAL SCREENING BILATERAL MAMMOGRAM WITH TOMO AND CAD

[R MLO synth-2D]
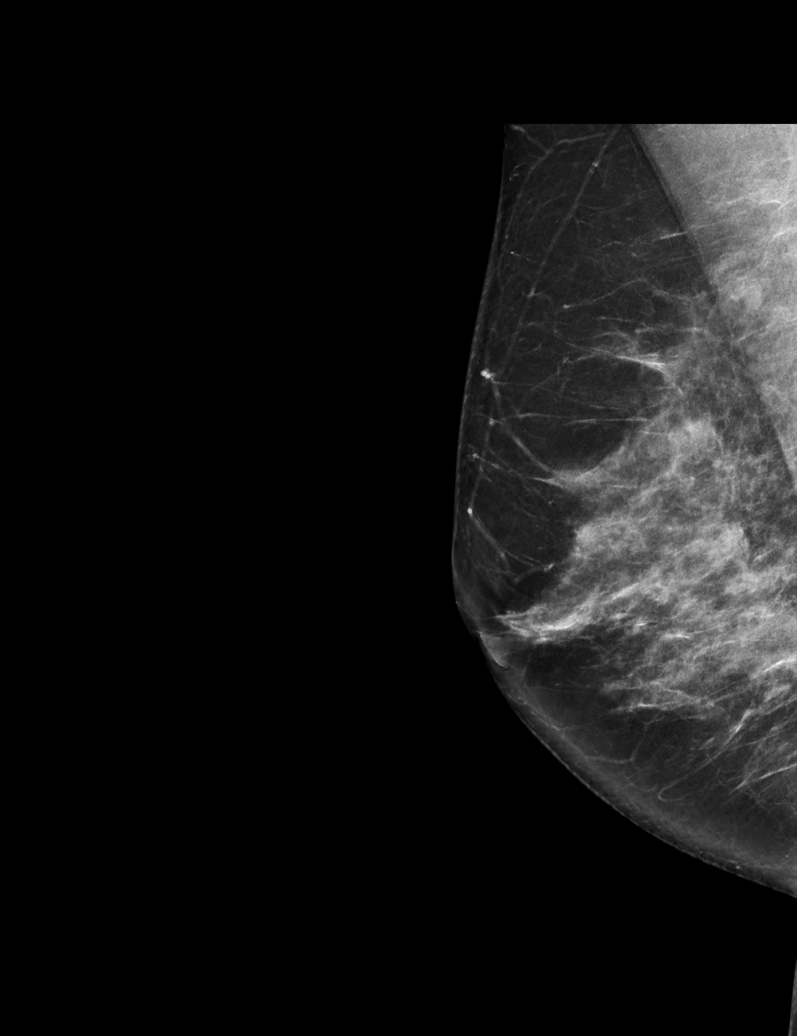

[R CC synth-2D (1 of 2)]
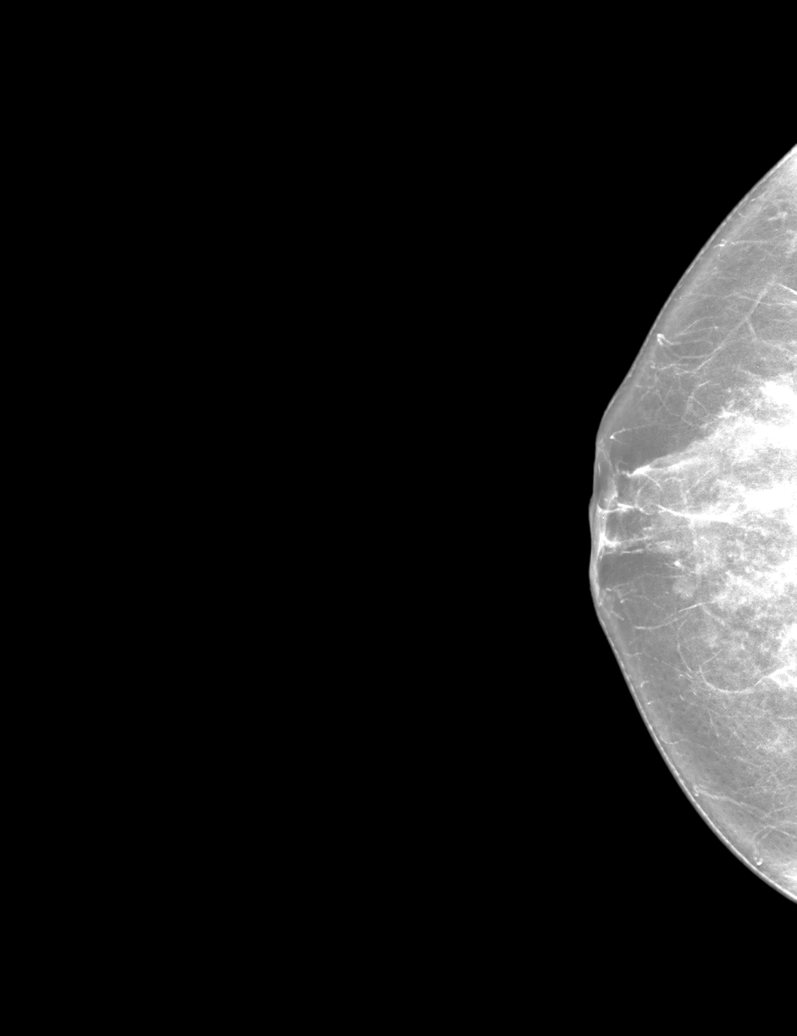

[L MLO synth-2D]
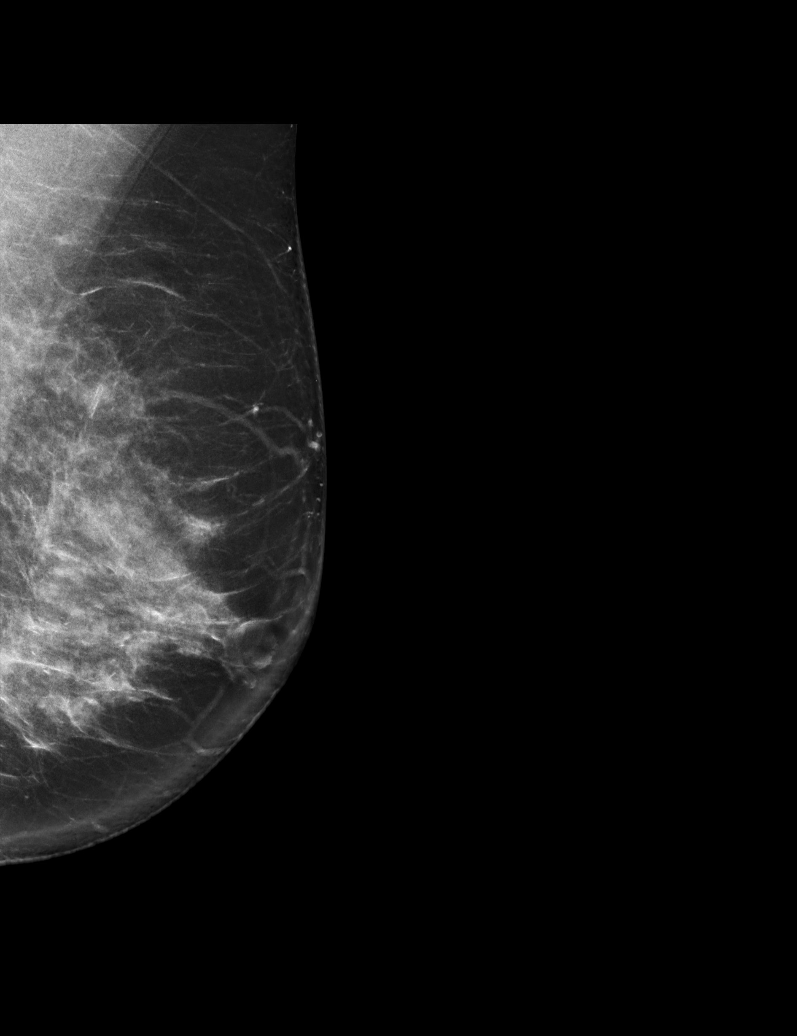

[R CC synth-2D (2 of 2)]
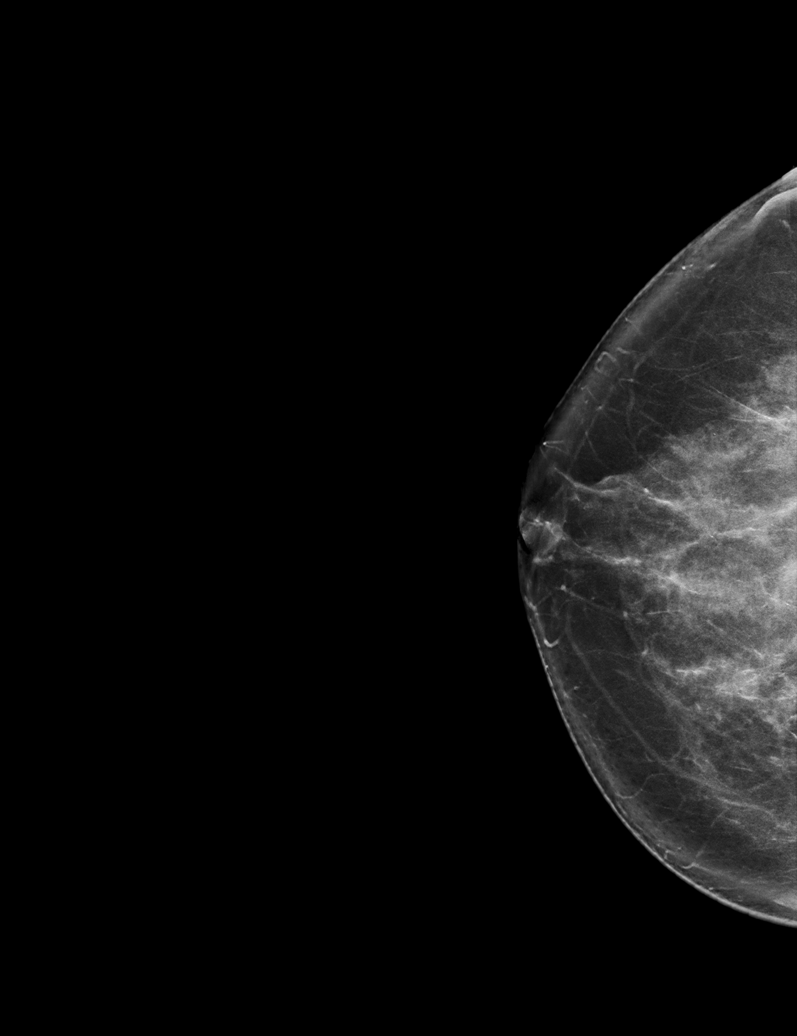

[L CC synth-2D (1 of 2)]
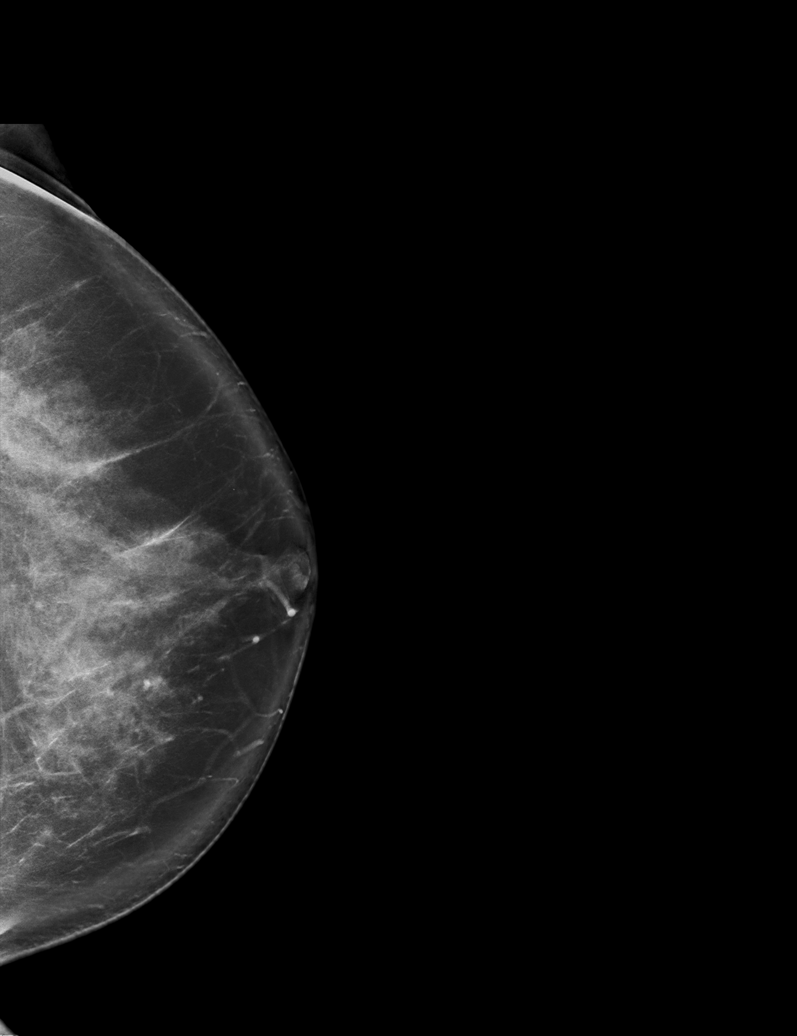

[L CC synth-2D (2 of 2)]
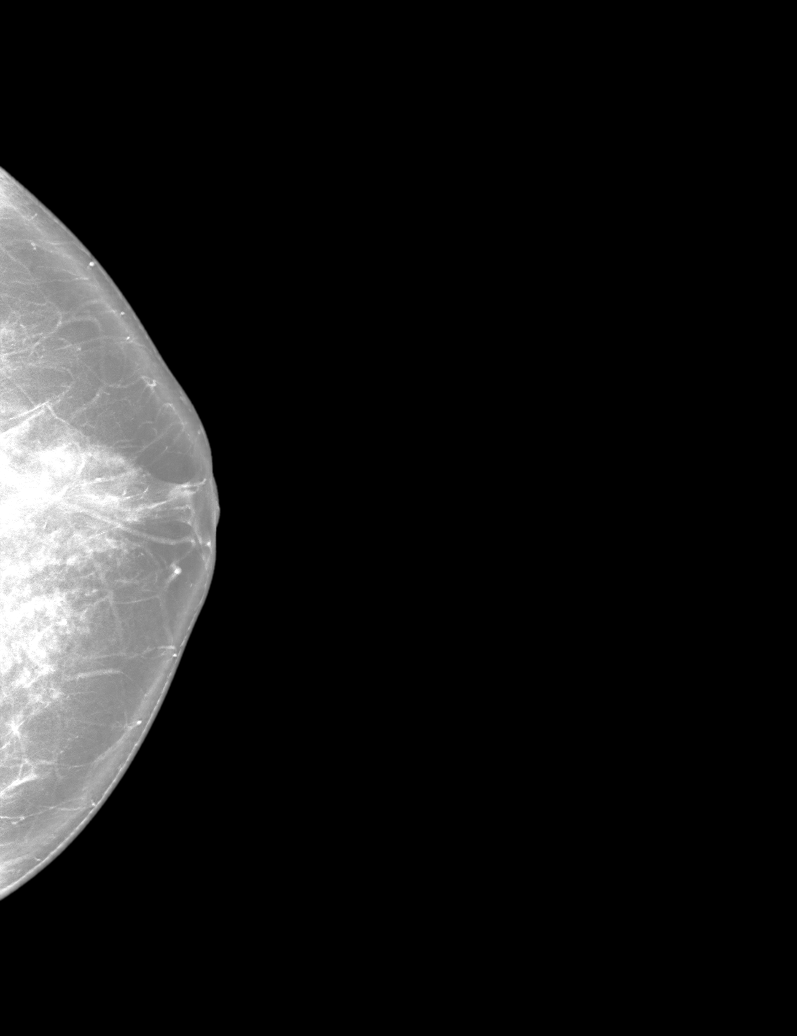

[6 of 36 positions shown; findings below may reference images not displayed]

ACR Breast Density Category d: The breast tissue is extremely dense,
which lowers the sensitivity of mammography
FINDINGS: There are no findings suspicious for malignancy. Images were
processed with CAD.
IMPRESSION: No mammographic evidence of malignancy. A result letter of this
screening mammogram will be mailed directly to the patient.

RECOMMENDATION:
Screening mammogram in one year. (Code:WO-0-ZI0)

BI-RADS CATEGORY  1: Negative.

## 2022-10-27 ENCOUNTER — Other Ambulatory Visit: Payer: Self-pay | Admitting: Family Medicine

## 2022-10-27 DIAGNOSIS — F32A Depression, unspecified: Secondary | ICD-10-CM

## 2022-10-29 DIAGNOSIS — Z1231 Encounter for screening mammogram for malignant neoplasm of breast: Secondary | ICD-10-CM

## 2022-11-08 ENCOUNTER — Other Ambulatory Visit: Payer: Self-pay | Admitting: Family Medicine

## 2022-11-08 DIAGNOSIS — F418 Other specified anxiety disorders: Secondary | ICD-10-CM

## 2022-11-09 ENCOUNTER — Encounter: Payer: Commercial Managed Care - PPO | Admitting: Family Medicine

## 2022-11-17 ENCOUNTER — Ambulatory Visit (INDEPENDENT_AMBULATORY_CARE_PROVIDER_SITE_OTHER): Payer: Commercial Managed Care - PPO | Admitting: Family Medicine

## 2022-11-17 ENCOUNTER — Encounter: Payer: Self-pay | Admitting: Family Medicine

## 2022-11-17 VITALS — BP 120/60 | HR 76 | Temp 98.4°F | Resp 18 | Ht 66.0 in | Wt 181.4 lb

## 2022-11-17 DIAGNOSIS — F32A Depression, unspecified: Secondary | ICD-10-CM

## 2022-11-17 DIAGNOSIS — Z0000 Encounter for general adult medical examination without abnormal findings: Secondary | ICD-10-CM

## 2022-11-17 MED ORDER — BUPROPION HCL ER (XL) 300 MG PO TB24
300.0000 mg | ORAL_TABLET | Freq: Every day | ORAL | 3 refills | Status: DC
Start: 2022-11-17 — End: 2023-11-26

## 2022-11-17 MED ORDER — CITALOPRAM HYDROBROMIDE 10 MG PO TABS: 10.0000 mg | ORAL_TABLET | Freq: Every day | ORAL | 3 refills | Status: DC

## 2022-11-17 NOTE — Assessment & Plan Note (Signed)
Ghm utd Check labs  See AVS  Health Maintenance  Topic Date Due   Cervical Cancer Screening (HPV/Pap Cotest)  02/02/2015   Zoster Vaccines- Shingrix (1 of 2) Never done   COVID-19 Vaccine (4 - 2023-24 season) 09/20/2022   HIV Screening  02/20/2023 (Originally 05/18/1987)   INFLUENZA VACCINE  04/19/2023 (Originally 08/20/2022)   MAMMOGRAM  05/05/2023   Colonoscopy  06/10/2023   DTaP/Tdap/Td (3 - Td or Tdap) 09/29/2024   Hepatitis C Screening  Completed   HPV VACCINES  Aged Out

## 2022-11-17 NOTE — Progress Notes (Signed)
Established Patient Office Visit  Subjective   Patient ID: Angela Harrington, female    DOB: [birth year]  Age: 52 y.o. MRN: [id number]  Chief Complaint  Patient presents with   Annual Exam    Pt states fasting     HPI Discussed the use of AI scribe software for clinical note transcription with the patient, who gave verbal consent to proceed.  History of Present Illness   The patient presents for a cpe and a medication refill of celexa and WellButrin xl. She expresses interest in a gene site test to determine the efficacy of her psych meds. She has heard positive reviews from a friend who had the test and experienced improved results after changing medications. The patient is also experiencing physical discomforts that she attributes to aging, including plantar fasciitis and aches in various joints. She has tried various remedies for the plantar fasciitis, including wearing different shoes and using an ankle brace. She also mentions having an allergic reaction to adhesive, which limits her treatment options.      Patient Active Problem List   Diagnosis Date Noted   Preventative health care 09/06/2020   Attention deficit hyperactivity disorder (ADHD), predominantly inattentive type 09/06/2020   Acute bacterial bronchitis 07/13/2014   Abdominal discomfort 02/23/2014   Colitis 02/23/2014   Overweight (BMI 25.0-29.9) 02/01/2013   Joint pain 11/18/2011   Depression with anxiety 10/02/2009   SHOULDER PAIN, RIGHT 10/02/2009   PALPITATIONS, RECURRENT 10/02/2009   ANEMIA-NOS 08/15/2007   ALLERGIC RHINITIS 08/15/2007   REACTIVE AIRWAY DISEASE 08/15/2007   SHORTNESS OF BREATH 08/15/2007   Past Medical History:  Diagnosis Date   Allergy    rhinitis   Anemia as a child   Endometriosis 1995   Past Surgical History:  Procedure Laterality Date   laporoscopy for endometriosis  1995   TONSILLECTOMY  1979   Social History   Tobacco Use   Smoking status: Never   Smokeless tobacco: Never   Substance Use Topics   Alcohol use: Yes    Comment: Socially   Drug use: No   Social History   Socioeconomic History   Marital status: Married    Spouse name: Not on file   Number of children: 0   Years of education: Not on file   Highest education level: Not on file  Occupational History   Occupation: attorney    Comment: [employer]  Tobacco Use   Smoking status: Never   Smokeless tobacco: Never  Substance and Sexual Activity   Alcohol use: Yes    Comment: Socially   Drug use: No   Sexual activity: Yes    Partners: Male  Other Topics Concern   Not on file  Social History Narrative   Regular exercise- no   Social Determinants of Health   Financial Resource Strain: Not on file  Food Insecurity: Not on file  Transportation Needs: Not on file  Physical Activity: Not on file  Stress: Not on file  Social Connections: Unknown (06/02/2021)   Received from [facility], [facility]   Social Network    Social Network: Not on file  Intimate Partner Violence: Unknown (04/24/2021)   Received from [employer], [facility]   HITS    Physically Hurt: Not on file    Insult or Talk Down To: Not on file    Threaten Physical Harm: Not on file    Scream or Curse: Not on file   Family Status  Relation Name Status  Mother  Alive   Father  Alive   Sister  (Not Specified)   Other 1st degree relative Alive  No partnership data on file   Family History  Problem Relation Age of Onset   Arthritis Mother        osteoarthritis   Hyperlipidemia Mother    Alcohol abuse Father    Arthritis Father        rheumatoid   Hypertension Father    Breast cancer Sister    Cancer Other        breast   No Known Allergies    ROS    Objective:     BP 120/60 (BP Location: Right Arm, Patient Position: Sitting, Cuff Size: Normal)   Pulse 76   Temp 98.4 F (36.9 C) (Oral)   Resp 18   Ht 5\' 6"  (1.676 m)   Wt 181 lb 6.4 oz (82.3 kg)   SpO2 97%   BMI 29.28 kg/m  BP  Readings from Last 3 Encounters:  11/17/22 120/60  01/09/22 118/76  09/11/21 106/70   Wt Readings from Last 3 Encounters:  11/17/22 181 lb 6.4 oz (82.3 kg)  09/11/21 176 lb 6.4 oz (80 kg)  07/18/21 173 lb 6.4 oz (78.7 kg)   SpO2 Readings from Last 3 Encounters:  11/17/22 97%  01/09/22 94%  09/11/21 97%      Physical Exam   No results found for any visits on 11/17/22.  Last CBC Lab Results  Component Value Date   WBC 6.9 09/11/2021   HGB 12.9 09/11/2021   HCT 38.6 09/11/2021   MCV 91.1 09/11/2021   MCH 29.8 09/30/2014   RDW 13.5 09/11/2021   PLT 249.0 09/11/2021   Last metabolic panel Lab Results  Component Value Date   GLUCOSE 88 09/11/2021   NA 136 09/11/2021   K 3.8 09/11/2021   CL 103 09/11/2021   CO2 27 09/11/2021   BUN 13 09/11/2021   CREATININE 0.89 09/11/2021   GFR 76.18 09/11/2021   CALCIUM 8.6 09/11/2021   PROT 6.6 09/11/2021   ALBUMIN 4.2 09/11/2021   BILITOT 0.4 09/11/2021   ALKPHOS 48 09/11/2021   AST 11 09/11/2021   ALT 8 09/11/2021   ANIONGAP 9 09/30/2014   Last lipids Lab Results  Component Value Date   CHOL 206 (H) 09/11/2021   HDL 69.70 09/11/2021   LDLCALC 116 (H) 09/11/2021   LDLDIRECT 141.7 11/18/2011   TRIG 102.0 09/11/2021   CHOLHDL 3 09/11/2021   Last hemoglobin A1c Lab Results  Component Value Date   HGBA1C 5.7 09/11/2021   Last thyroid functions Lab Results  Component Value Date   TSH 1.46 09/11/2021   Last vitamin D No results found for: "25OHVITD2", "25OHVITD3", "VD25OH" Last vitamin B12 and Folate Lab Results  Component Value Date   VITAMINB12 347 10/02/2009   FOLATE 6.7 10/02/2009      The 10-year ASCVD risk score (Arnett DK, et al., 2019) is: 0.8%    Assessment & Plan:   Problem List Items Addressed This Visit       Unprioritized   Preventative health care - Primary   Relevant Orders   CBC with Differential/Platelet   Comprehensive metabolic panel   Lipid panel   TSH   Other Visit  Diagnoses     Depression       Relevant Medications   citalopram (CELEXA) 10 MG tablet   buPROPion (WELLBUTRIN XL) 300 MG 24 hr tablet     Assessment and Plan  Medication Management Discussed the potential benefits of GeneSight testing for psychotropic medications. Patient currently on Celexa  and Wellbutrin xl -Perform GeneSight testing today.  Plantar Fasciitis Chronic issue, currently managed with an ankle brace. No acute exacerbation. -Continue current management.  General Health Maintenance -Recommended flu shot and shingles vaccine. Patient declined flu shot today due to upcoming weekend plans, but plans to get it next week. Discussed eligibility for shingles vaccine now that patient is 52. -Plan for patient to receive flu shot next week and consider shingles vaccine. -Continue regular dental visits. -Encouraged to establish care with a new GYN following previous provider's retirement. -Continue regular eye exams. -Continue monitoring glucose and cholesterol levels with regular labs.      C   No follow-ups on file.    Donato Schultz, DO

## 2022-11-17 NOTE — Progress Notes (Signed)
Per PCP, [employer] collected. Collection performed by Luster Landsberg, CMA  [employer] Tracking: [phone]  [employer] Pickup scheduled for 11/17/22 4p-5p.   Confirmation Number: [id number]  Angela Harrington

## 2022-11-18 ENCOUNTER — Telehealth: Payer: Self-pay

## 2022-11-18 LAB — COMPREHENSIVE METABOLIC PANEL
ALT: 9 U/L (ref 0–35)
AST: 13 U/L (ref 0–37)
Albumin: 4.5 g/dL (ref 3.5–5.2)
Alkaline Phosphatase: 111 U/L (ref 39–117)
BUN: 16 mg/dL (ref 6–23)
CO2: 29 meq/L (ref 19–32)
Calcium: 9.5 mg/dL (ref 8.4–10.5)
Chloride: 102 meq/L (ref 96–112)
Creatinine, Ser: 0.87 mg/dL (ref 0.40–1.20)
GFR: 77.64 mL/min (ref >60.00)
Glucose, Bld: 91 mg/dL (ref 70–99)
Potassium: 4.1 meq/L (ref 3.5–5.1)
Sodium: 140 meq/L (ref 135–145)
Total Bilirubin: 0.6 mg/dL (ref 0.2–1.2)
Total Protein: 7.4 g/dL (ref 6.0–8.3)

## 2022-11-18 LAB — CBC WITH DIFFERENTIAL/PLATELET
Basophils Absolute: 0 10*3/uL (ref 0.0–0.1)
Basophils Relative: 0.5 % (ref 0.0–3.0)
Eosinophils Absolute: 0.1 10*3/uL (ref 0.0–0.7)
Eosinophils Relative: 0.9 % (ref 0.0–5.0)
HCT: 42.2 % (ref 36.0–46.0)
Hemoglobin: 13.4 g/dL (ref 12.0–15.0)
Lymphocytes Relative: 24.5 % (ref 12.0–46.0)
Lymphs Abs: 1.9 10*3/uL (ref 0.7–4.0)
MCHC: 31.7 g/dL (ref 30.0–36.0)
MCV: 90.2 fL (ref 78.0–100.0)
Monocytes Absolute: 0.5 10*3/uL (ref 0.1–1.0)
Monocytes Relative: 6.2 % (ref 3.0–12.0)
Neutro Abs: 5.2 10*3/uL (ref 1.4–7.7)
Neutrophils Relative %: 67.9 % (ref 43.0–77.0)
Platelets: 300 10*3/uL (ref 150.0–400.0)
RBC: 4.67 Mil/uL (ref 3.87–5.11)
RDW: 14.1 % (ref 11.5–15.5)
WBC: 7.7 10*3/uL (ref 4.0–10.5)

## 2022-11-18 LAB — LIPID PANEL
Cholesterol: 246 mg/dL — ABNORMAL HIGH (ref 0–200)
HDL: 85.9 mg/dL (ref >39.00)
LDL Cholesterol: 149 mg/dL — ABNORMAL HIGH (ref 0–99)
NonHDL: 159.94
Total CHOL/HDL Ratio: 3
Triglycerides: 55 mg/dL (ref 0.0–149.0)
VLDL: 11 mg/dL (ref 0.0–40.0)

## 2022-11-18 LAB — TSH: TSH: 1.37 u[IU]/mL (ref 0.35–5.50)

## 2022-11-18 NOTE — Telephone Encounter (Signed)
Pt brought Health screening form to [facility] yesterday. Form completed and faxed

## 2022-11-22 ENCOUNTER — Other Ambulatory Visit: Payer: Self-pay | Admitting: Family Medicine

## 2022-11-22 DIAGNOSIS — E785 Hyperlipidemia, unspecified: Secondary | ICD-10-CM

## 2022-11-27 ENCOUNTER — Encounter: Payer: Self-pay | Admitting: Family Medicine

## 2022-12-22 ENCOUNTER — Encounter: Payer: Self-pay | Admitting: Family Medicine

## 2023-07-05 ENCOUNTER — Other Ambulatory Visit: Payer: Self-pay | Admitting: Family Medicine

## 2023-07-05 DIAGNOSIS — Z1231 Encounter for screening mammogram for malignant neoplasm of breast: Secondary | ICD-10-CM

## 2023-07-21 ENCOUNTER — Ambulatory Visit
Admission: RE | Admit: 2023-07-21 | Discharge: 2023-07-21 | Disposition: A | Discharge status: Home / Self Care | Source: Ambulatory Visit | Attending: Family Medicine | Admitting: Family Medicine

## 2023-07-21 DIAGNOSIS — Z1231 Encounter for screening mammogram for malignant neoplasm of breast: Secondary | ICD-10-CM

## 2023-08-26 ENCOUNTER — Other Ambulatory Visit: Payer: Self-pay | Admitting: Family Medicine

## 2023-08-26 DIAGNOSIS — F32A Depression, unspecified: Secondary | ICD-10-CM

## 2023-11-25 ENCOUNTER — Encounter: Admitting: Family Medicine

## 2023-11-26 ENCOUNTER — Other Ambulatory Visit: Payer: Self-pay | Admitting: Family Medicine

## 2023-11-26 DIAGNOSIS — F32A Depression, unspecified: Secondary | ICD-10-CM

## 2023-12-27 ENCOUNTER — Other Ambulatory Visit: Payer: Self-pay | Admitting: Family Medicine

## 2023-12-27 DIAGNOSIS — F32A Depression, unspecified: Secondary | ICD-10-CM
